# Patient Record
Sex: Female | Born: 2000 | Race: Black or African American | Hispanic: No | Smoking: Never smoker
Health system: Southern US, Community
[De-identification: ages and names within clinical notes are randomized; demographics above are authoritative.]

## PROBLEM LIST (undated history)

## (undated) DIAGNOSIS — L709 Acne, unspecified: Secondary | ICD-10-CM

## (undated) DIAGNOSIS — E559 Vitamin D deficiency, unspecified: Secondary | ICD-10-CM

## (undated) HISTORY — PX: NO PAST SURGERIES: SHX2092

## (undated) HISTORY — DX: Vitamin D deficiency, unspecified: E55.9

## (undated) HISTORY — DX: Acne, unspecified: L70.9

---
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
## 2019-01-07 ENCOUNTER — Encounter: Payer: Self-pay | Admitting: Certified Nurse Midwife

## 2019-01-08 ENCOUNTER — Other Ambulatory Visit: Payer: Self-pay

## 2019-01-08 MED ORDER — FUSION PLUS PO CAPS: 1.0000 | ORAL_CAPSULE | Freq: Every day | ORAL | 10 refills | Status: DC

## 2019-02-11 ENCOUNTER — Encounter: Payer: Medicaid Other | Admitting: Family Medicine

## 2019-02-25 ENCOUNTER — Other Ambulatory Visit: Payer: Self-pay

## 2019-02-25 ENCOUNTER — Ambulatory Visit (INDEPENDENT_AMBULATORY_CARE_PROVIDER_SITE_OTHER): Payer: Medicaid Other | Admitting: Certified Nurse Midwife

## 2019-02-25 ENCOUNTER — Encounter: Payer: Self-pay | Admitting: Certified Nurse Midwife

## 2019-02-25 VITALS — BP 127/82 | HR 88 | Ht 68.0 in | Wt 136.0 lb

## 2019-02-25 DIAGNOSIS — N946 Dysmenorrhea, unspecified: Secondary | ICD-10-CM | POA: Diagnosis not present

## 2019-02-25 DIAGNOSIS — Z8742 Personal history of other diseases of the female genital tract: Secondary | ICD-10-CM | POA: Diagnosis not present

## 2019-02-25 DIAGNOSIS — Z3045 Encounter for surveillance of transdermal patch hormonal contraceptive device: Secondary | ICD-10-CM | POA: Diagnosis not present

## 2019-02-25 NOTE — Patient Instructions (Addendum)
Dysmenorrhea Dysmenorrhea means painful cramps during your period (menstrual period). You will have pain in your lower belly (abdomen). The pain is caused by the tightening (contracting) of the muscles of the womb (uterus). The pain may be mild or very bad. With this condition, you may:  Have a headache.  Feel sick to your stomach (nauseous).  Throw up (vomit).  Have lower back pain. Follow these instructions at home: Helping pain and cramping   Put heat on your lower back or belly when you have pain or cramps. Use the heat source that your doctor tells you to use. ? Place a towel between your skin and the heat. ? Leave the heat on for 20-30 minutes. ? Remove the heat if your skin turns bright red. This is especially important if you cannot feel pain, heat, or cold. ? Do not have a heating pad on during sleep.  Do aerobic exercises. These include walking, swimming, or biking. These may help with cramps.  Massage your lower back or belly. This may help lessen pain. General instructions  Take over-the-counter and prescription medicines only as told by your doctor.  Do not drive or use heavy machinery while taking prescription pain medicine.  Avoid alcohol and caffeine during and right before your period. These can make cramps worse.  Do not use any products that have nicotine or tobacco. These include cigarettes and e-cigarettes. If you need help quitting, ask your doctor.  Keep all follow-up visits as told by your doctor. This is important. Contact a doctor if:  You have pain that gets worse.  You have pain that does not get better with medicine.  You have pain during sex.  You feel sick to your stomach or you throw up during your period, and medicine does not help. Get help right away if:  You pass out (faint). Summary  Dysmenorrhea means painful cramps during your period (menstrual period).  Put heat on your lower back or belly when you have pain or cramps.  Do  exercises like walking, swimming, or biking to help with cramps.  Contact a doctor if you have pain during sex. This information is not intended to replace advice given to you by your health care provider. Make sure you discuss any questions you have with your health care provider. Document Released: 02/10/2009 Document Revised: 12/01/2016 Document Reviewed: 12/01/2016 Elsevier Interactive Patient Education  2019 Elsevier Inc. Ethinyl Estradiol; Norelgestromin skin patches What is this medicine? ETHINYL ESTRADIOL;NORELGESTROMIN (ETH in il es tra DYE ole; nor el JES troe min) skin patch is used as a contraceptive (birth control method). This medicine combines two types of female hormones, an estrogen and a progestin. This patch is used to prevent ovulation and pregnancy. This medicine may be used for other purposes; ask your health care provider or pharmacist if you have questions. COMMON BRAND NAME(S): Ortho Christianne Borrow What should I tell my health care provider before I take this medicine? They need to know if you have or ever had any of these conditions: -abnormal vaginal bleeding -blood vessel disease or blood clots -breast, cervical, endometrial, ovarian, liver, or uterine cancer -diabetes -gallbladder disease -heart disease or recent heart attack -high blood pressure -high cholesterol -kidney disease -liver disease -migraine headaches -stroke -systemic lupus erythematosus (SLE) -tobacco smoker -an unusual or allergic reaction to estrogens, progestins, other medicines, foods, dyes, or preservatives -pregnant or trying to get pregnant -breast-feeding How should I use this medicine? This patch is applied to the skin. Follow the directions  on the prescription label. Apply to clean, dry, healthy skin on the buttock, abdomen, upper outer arm or upper torso, in a place where it will not be rubbed by tight clothing. Do not use lotions or other cosmetics on the site where the patch will  go. Press the patch firmly in place for 10 seconds to ensure good contact with the skin. Change the patch every 7 days on the same day of the week for 3 weeks. You will then have a break from the patch for 1 week, after which you will apply a new patch. Do not use your medicine more often than directed. Contact your pediatrician regarding the use of this medicine in children. Special care may be needed. This medicine has been used in female children who have started having menstrual periods. A patient package insert for the product will be given with each prescription and refill. Read this sheet carefully each time. The sheet may change frequently. Overdosage: If you think you have taken too much of this medicine contact a poison control center or emergency room at once. NOTE: This medicine is only for you. Do not share this medicine with others. What if I miss a dose? You will need to replace your patch once a week as directed. If your patch is lost or falls off, contact your health care professional for advice. You may need to use another form of birth control if your patch has been off for more than 1 day. What may interact with this medicine? Do not take this medicine with the following medication: -dasabuvir; ombitasvir; paritaprevir; ritonavir -ombitasvir; paritaprevir; ritonavir This medicine may also interact with the following medications: -acetaminophen -antibiotics or medicines for infections, especially rifampin, rifabutin, rifapentine, and griseofulvin, and possibly penicillins or tetracyclines -aprepitant -ascorbic acid (vitamin C) -atorvastatin -barbiturate medicines, such as phenobarbital -bosentan -carbamazepine -caffeine -clofibrate -cyclosporine -dantrolene -doxercalciferol -felbamate -grapefruit juice -hydrocortisone -medicines for anxiety or sleeping problems, such as diazepam or temazepam -medicines for diabetes, including  pioglitazone -modafinil -mycophenolate -nefazodone -oxcarbazepine -phenytoin -prednisolone -ritonavir or other medicines for HIV infection or AIDS -rosuvastatin -selegiline -soy isoflavones supplements -St. John's wort -tamoxifen or raloxifene -theophylline -thyroid hormones -topiramate -warfarin This list may not describe all possible interactions. Give your health care provider a list of all the medicines, herbs, non-prescription drugs, or dietary supplements you use. Also tell them if you smoke, drink alcohol, or use illegal drugs. Some items may interact with your medicine. What should I watch for while using this medicine? Visit your doctor or health care professional for regular checks on your progress. You will need a regular breast and pelvic exam and Pap smear while on this medicine. Use an additional method of contraception during the first cycle that you use this patch. If you have any reason to think you are pregnant, stop using this medicine right away and contact your doctor or health care professional. If you are using this medicine for hormone related problems, it may take several cycles of use to see improvement in your condition. Smoking increases the risk of getting a blood clot or having a stroke while you are using hormonal birth control, especially if you are more than 19 years old. You are strongly advised not to smoke. This medicine can make your body retain fluid, making your fingers, hands, or ankles swell. Your blood pressure can go up. Contact your doctor or health care professional if you feel you are retaining fluid. This medicine can make you more sensitive to the sun.  Keep out of the sun. If you cannot avoid being in the sun, wear protective clothing and use sunscreen. Do not use sun lamps or tanning beds/booths. If you wear contact lenses and notice visual changes, or if the lenses begin to feel uncomfortable, consult your eye care specialist. In some  women, tenderness, swelling, or minor bleeding of the gums may occur. Notify your dentist if this happens. Brushing and flossing your teeth regularly may help limit this. See your dentist regularly and inform your dentist of the medicines you are taking. If you are going to have elective surgery or a MRI, you may need to stop using this medicine before the surgery or MRI. Consult your health care professional for advice. This medicine does not protect you against HIV infection (AIDS) or any other sexually transmitted diseases. What side effects may I notice from receiving this medicine? Side effects that you should report to your doctor or health care professional as soon as possible: -breast tissue changes or discharge -changes in vaginal bleeding during your period or between your periods -chest pain -coughing up blood -dizziness or fainting spells -headaches or migraines -leg, arm or groin pain -severe or sudden headaches -stomach pain (severe) -sudden shortness of breath -sudden loss of coordination, especially on one side of the body -speech problems -symptoms of vaginal infection like itching, irritation or unusual discharge -tenderness in the upper abdomen -vomiting -weakness or numbness in the arms or legs, especially on one side of the body -yellowing of the eyes or skin Side effects that usually do not require medical attention (report to your doctor or health care professional if they continue or are bothersome): -breakthrough bleeding and spotting that continues beyond the 3 initial cycles of pills -breast tenderness -mood changes, anxiety, depression, frustration, anger, or emotional outbursts -increased sensitivity to sun or ultraviolet light -nausea -skin rash, acne, or brown spots on the skin -weight gain (slight) This list may not describe all possible side effects. Call your doctor for medical advice about side effects. You may report side effects to FDA at  1-800-FDA-1088. Where should I keep my medicine? Keep out of the reach of children. Store at room temperature between 15 and 30 degrees C (59 and 86 degrees F). Keep the patch in its pouch until time of use. Throw away any unused medicine after the expiration date. Dispose of used patches properly. Since a used patch may still contain active hormones, fold the patch in half so that it sticks to itself prior to disposal. Throw away in a place where children or pets cannot reach. NOTE: This sheet is a summary. It may not cover all possible information. If you have questions about this medicine, talk to your doctor, pharmacist, or health care provider.  2019 Elsevier/Gold Standard (2016-07-25 07:59:03)

## 2019-02-25 NOTE — Progress Notes (Signed)
Virtual Visit via Telephone Note  I connected with Casey Garza on 02/25/19 at 3:00 PM by telephone and verified that I am speaking with the correct person using two identifiers.   I discussed the limitations, risks, security and privacy concerns of performing an evaluation and management service by telephone and the availability of in person appointments. I also discussed with the patient that there may be a patient responsible charge related to this service. The patient expressed understanding and agreed to proceed.   History of Present Illness:  Patient last seen in office on 12/25/2018 for evaluation of abnormal uterine bleeding. Previously used depo-provera as contraception due inability to remember pill. Discussed all options and decided to try patch.   Likes patch, easy to use. Notes new onset dysmenorrhea. Manages symptoms with Motrin and Tylenol as needed.   Denies difficulty breathing or respiratory distress, chest pain, abdominal pain, excessive vaginal bleeding, dysuria, and leg pain or swelling.   Observations/Objective:  Patient's last menstrual period was 02/25/2019. Period Cycle (Days): 28 Period Duration (Days): Four (4) to five (5) Period Pattern: Regular Menstrual Flow: Moderate Menstrual Control: Thin pad, Tampon Menstrual Control Change Freq (Hours): One (1) to two (2)  Dysmenorrhea: (!) Moderate Dysmenorrhea Symptoms: Cramping, Other (Comment)(back pain)   Assessment and Plan:  Contraceptive patch use-will continue Dysmenorrhea-information given regarding use of herbs, see MyChart  Follow Up Instructions:    I discussed the assessment and treatment plan with the patient. The patient was provided an opportunity to ask questions and all were answered. The patient agreed with the plan and demonstrated an understanding of the instructions.   The patient was advised to call back or seek an in-person evaluation if the symptoms worsen or if the condition fails  to improve as anticipated.  Advised to return to clinic in one (1) year for ANNUAL EXAM.   I provided 10 minutes of non-face-to-face time during this encounter.    Gunnar Bulla, CNM [facility], [facility] 02/25/19 3:11 PM

## 2019-03-13 ENCOUNTER — Encounter: Payer: Self-pay | Admitting: Family Medicine

## 2019-03-15 ENCOUNTER — Ambulatory Visit: Payer: Medicaid Other

## 2019-03-19 ENCOUNTER — Telehealth: Payer: Self-pay | Admitting: Certified Nurse Midwife

## 2019-03-19 ENCOUNTER — Other Ambulatory Visit: Payer: Self-pay | Admitting: *Deleted

## 2019-03-19 MED ORDER — FLUCONAZOLE 150 MG PO TABS: 150.0000 mg | ORAL_TABLET | Freq: Once | ORAL | 0 refills | Status: DC

## 2019-03-19 NOTE — Telephone Encounter (Signed)
The patient called and stated that she has a yeast infection. The patient is hoping to receive a call back sometime today from her nurse. Please advise.

## 2019-03-19 NOTE — Telephone Encounter (Signed)
Called pt sent in diflucan pt voiced understanding

## 2019-03-22 ENCOUNTER — Telehealth: Payer: Self-pay

## 2019-03-22 ENCOUNTER — Other Ambulatory Visit: Payer: Self-pay

## 2019-03-22 ENCOUNTER — Other Ambulatory Visit: Payer: Self-pay | Admitting: Obstetrics and Gynecology

## 2019-03-22 ENCOUNTER — Telehealth: Payer: Self-pay | Admitting: Certified Nurse Midwife

## 2019-03-22 DIAGNOSIS — R350 Frequency of micturition: Secondary | ICD-10-CM

## 2019-03-22 NOTE — Telephone Encounter (Signed)
Patient picked up script for diflucan on 4/21. She called stating her symptoms are worsening and finished the medication. Please Advise.

## 2019-03-22 NOTE — Telephone Encounter (Signed)
Patient called to state she took diflucan 3 days in a row- not 1 tab every 3 days as prescribed. She now states she feels horrible. She is aldo c/o frequency and urgency with burning. She will come to office in the next 15 minutes to leave a urine sample for a culture to be sent to lab.

## 2019-03-25 ENCOUNTER — Other Ambulatory Visit: Payer: Medicaid Other

## 2019-03-25 ENCOUNTER — Other Ambulatory Visit: Payer: Self-pay

## 2019-03-25 DIAGNOSIS — R350 Frequency of micturition: Secondary | ICD-10-CM | POA: Diagnosis not present

## 2019-03-28 ENCOUNTER — Other Ambulatory Visit: Payer: Self-pay | Admitting: Certified Nurse Midwife

## 2019-03-28 DIAGNOSIS — N3000 Acute cystitis without hematuria: Secondary | ICD-10-CM

## 2019-03-28 LAB — URINE CULTURE

## 2019-03-28 LAB — SPECIMEN STATUS REPORT

## 2019-03-28 MED ORDER — NITROFURANTOIN MONOHYD MACRO 100 MG PO CAPS: 100.0000 mg | ORAL_CAPSULE | Freq: Two times a day (BID) | ORAL | 0 refills | Status: DC

## 2019-05-20 ENCOUNTER — Other Ambulatory Visit: Payer: Self-pay

## 2019-05-20 ENCOUNTER — Other Ambulatory Visit (INDEPENDENT_AMBULATORY_CARE_PROVIDER_SITE_OTHER): Payer: Medicaid Other | Admitting: Certified Nurse Midwife

## 2019-05-20 ENCOUNTER — Other Ambulatory Visit: Payer: Medicaid Other

## 2019-05-20 DIAGNOSIS — R300 Dysuria: Secondary | ICD-10-CM

## 2019-05-20 DIAGNOSIS — R319 Hematuria, unspecified: Secondary | ICD-10-CM

## 2019-05-20 DIAGNOSIS — N898 Other specified noninflammatory disorders of vagina: Secondary | ICD-10-CM

## 2019-05-20 LAB — POCT URINALYSIS DIPSTICK
Bilirubin, UA: NEGATIVE
Glucose, UA: NEGATIVE
Ketones, UA: NEGATIVE
Leukocytes, UA: NEGATIVE
Nitrite, UA: NEGATIVE
Protein, UA: POSITIVE — AB
Spec Grav, UA: 1.02 (ref 1.010–1.025)
Urobilinogen, UA: 0.2 E.U./dL
pH, UA: 5 (ref 5.0–8.0)

## 2019-05-20 MED ORDER — PHENAZOPYRIDINE HCL 200 MG PO TABS: 200.0000 mg | ORAL_TABLET | Freq: Three times a day (TID) | ORAL | 1 refills | Status: DC | PRN

## 2019-05-20 MED ORDER — NITROFURANTOIN MONOHYD MACRO 100 MG PO CAPS: 100.0000 mg | ORAL_CAPSULE | Freq: Two times a day (BID) | ORAL | 0 refills | Status: DC

## 2019-05-20 NOTE — Progress Notes (Signed)
Patient stopped by the office to leave a urine specimen for a dip and culture. While speaking with the patient she mentioned she had a "fishy" vaginal smell. Per ML patient was given a NuSwab to collect a self specimen to be sent to lab. Patient complied and all questions were answered.

## 2019-05-22 ENCOUNTER — Other Ambulatory Visit: Payer: Self-pay | Admitting: Certified Nurse Midwife

## 2019-05-22 DIAGNOSIS — N3001 Acute cystitis with hematuria: Secondary | ICD-10-CM

## 2019-05-22 LAB — URINE CULTURE

## 2019-05-22 MED ORDER — SULFAMETHOXAZOLE-TRIMETHOPRIM 800-160 MG PO TABS: 1.0000 | ORAL_TABLET | Freq: Two times a day (BID) | ORAL | 1 refills | Status: DC

## 2019-05-23 LAB — NUSWAB VAGINITIS PLUS (VG+)
Atopobium vaginae: HIGH Score — AB
Candida albicans, NAA: NEGATIVE
Candida glabrata, NAA: NEGATIVE
Chlamydia trachomatis, NAA: NEGATIVE
Neisseria gonorrhoeae, NAA: NEGATIVE
Trich vag by NAA: NEGATIVE

## 2019-05-24 ENCOUNTER — Encounter: Payer: Self-pay | Admitting: Certified Nurse Midwife

## 2019-05-24 ENCOUNTER — Other Ambulatory Visit: Payer: Self-pay | Admitting: Certified Nurse Midwife

## 2019-05-24 ENCOUNTER — Telehealth: Payer: Self-pay | Admitting: Certified Nurse Midwife

## 2019-05-24 DIAGNOSIS — N390 Urinary tract infection, site not specified: Secondary | ICD-10-CM

## 2019-05-24 MED ORDER — CIPROFLOXACIN HCL 500 MG PO TABS: 500.0000 mg | ORAL_TABLET | Freq: Two times a day (BID) | ORAL | 0 refills | Status: DC

## 2019-05-24 NOTE — Telephone Encounter (Signed)
Spoke with patient, aware Cipro was sent in.  Patient verbalized understanding.  Allergies updated in computer.

## 2019-05-24 NOTE — Telephone Encounter (Signed)
Patient called stating she is allergic to bactrim. She broke out in a rash and was feeling very nauseous. She contacted [facility] and they advised her to stop the medication and contact her Dr. Please Advise.-KEC

## 2019-05-24 NOTE — Telephone Encounter (Signed)
Please contact patient, Rx Cipro already sent to pharmacy on file and allergies updated in computer. Thanks, JML

## 2019-06-10 ENCOUNTER — Encounter: Payer: Self-pay | Admitting: Family Medicine

## 2019-06-11 ENCOUNTER — Other Ambulatory Visit: Payer: Self-pay

## 2019-06-11 ENCOUNTER — Encounter: Payer: Self-pay | Admitting: Nurse Practitioner

## 2019-06-11 ENCOUNTER — Ambulatory Visit (INDEPENDENT_AMBULATORY_CARE_PROVIDER_SITE_OTHER): Payer: Medicaid Other | Admitting: Nurse Practitioner

## 2019-06-11 VITALS — BP 112/68 | HR 65 | Temp 97.3°F | Resp 16 | Ht 68.0 in | Wt 147.4 lb

## 2019-06-11 DIAGNOSIS — Z0000 Encounter for general adult medical examination without abnormal findings: Secondary | ICD-10-CM

## 2019-06-11 NOTE — Progress Notes (Signed)
Routine Well-Adolescent Visit   PCP: Arnetha Courser, MD   History was provided by the patient.  Casey Garza is a 18 y.o. female who is here for physical   Current concerns: none    Adolescent Assessment:  Confidentiality was discussed with the patient and if applicable, with caregiver as well.  Home and Environment:  Lives with: lives at home with mom and brother Parental relations: good  Friends/Peers: neighbor Nutrition/Eating Behaviors: snacks mostly throught out the day- has breakfast- biscuit and fries, snacks on popcorn, cheez-its, candy occasionally, dinner- homecooked- hamburgers, onions, rice, green beans.  Drinks: water 4 bottles. gatorade- 1 bottle  Sports/Exercise: just walking at work.   Education and Employment:  School Status: in [employer] in college  in regular classroom and is doing well School History: School attendance is regular. Work: working full-time during the summer  Activities:  none   Patient reports being comfortable and safe at school and at home? Yes  Smoking: no vaping Secondhand smoke exposure? no Drugs/EtOH: denies    Sexuality:  -Menarche: post menarchal, onset 18 years old  - females:  last menses: today  - Menstrual History: regular every month without intermenstrual spotting  - Sexually active? yes -  monogamous relationship  - sexual partners in last year: one  - contraception use: patch - Last STI Screening: January 220  - Violence/Abuse: denies   Mood: Suicidality and Depression: denies Depression screen [facility] 2/9 06/11/2019 09/21/2018  Decreased Interest 0 0  Down, Depressed, Hopeless 0 0  PHQ - 2 Score 0 0  Altered sleeping 0 0  Tired, decreased energy 0 0  Change in appetite 0 0  Feeling bad or failure about yourself  0 0  Trouble concentrating 0 0  Moving slowly or fidgety/restless 0 0  Suicidal thoughts 0 0  PHQ-9 Score 0 0  Difficult doing work/chores Not difficult at all Not difficult at all   Negative.   Weapons: denies     Physical Exam:  There were no vitals taken for this visit. Blood pressure percentiles are not available for patients who are 18 years or older.  General Appearance:   alert, oriented, no acute distress  HENT: Normocephalic, no obvious abnormality, PERRL, EOM's intact, conjunctiva clear  Mouth:   Normal appearing teeth, no obvious discoloration, dental caries, or dental caps  Neck:   Supple; thyroid: no enlargement, symmetric, no tenderness/mass/nodules  Lungs:   Clear to auscultation bilaterally, normal work of breathing  Heart:   Regular rate and rhythm, S1 and S2 normal, no murmurs;   Abdomen:   Soft, non-tender, no mass, or organomegaly  GU genitalia not examined  Musculoskeletal:   Tone and strength strong and symmetrical, all extremities               Lymphatic:   No cervical adenopathy  Skin/Hair/Nails:   Skin warm, dry and intact, no rashes, no bruises or petechiae  Neurologic:   Strength, gait, and coordination normal and age-appropriate    Assessment/Plan:  BMI: is appropriate for age  Immunizations today: discussed HPV second dose- declined.   Discussed with adolescent   the importance of limiting screen time to no more than 2 hours per day, exercise daily for at least 2 hours, eat 6 servings of fruit and vegetables daily, eat tree nuts ( pistachios, pecans , almonds...) one serving every other day, eat fish twice weekly. Read daily. Get involved in school. Have responsibilities  at home. To avoid STI's, practice abstinence, if  unable use condoms and stick with one partner.  Discussed importance of contraception if sexually active to avoid unwanted pregnancy.   Patient declined blood work today- discussed b12 and vitamin D supplementation.  - Follow-up visit in 1 year for next visit, or sooner as needed.   Cheryle HorsfallElizabeth E Yessenia Maillet, NP

## 2019-06-11 NOTE — Patient Instructions (Addendum)
Make sure you you are  limiting screen time to no more than 2 hours per day, exercise daily for at least 2 hours, eat 5 servings of fruit and vegetables daily, eat tree nuts ( pistachios, pecans , almonds...) one serving every other day, eat fish twice weekly. Read daily. Get involved in school. Have responsibilities  at home. To avoid STI's, practice abstinence, if unable use condoms and stick with one partner.  Discussed importance of contraception if sexually active to avoid unwanted pregnancy.    Please take 500 mcg of vitamin B12 daily and 800-2,000 IU of vitamin D3 daily- you may get these both in a multivitamin instead of 2 separate tablets.

## 2019-07-03 ENCOUNTER — Ambulatory Visit (INDEPENDENT_AMBULATORY_CARE_PROVIDER_SITE_OTHER): Payer: Medicaid Other | Admitting: Family Medicine

## 2019-07-03 ENCOUNTER — Other Ambulatory Visit: Payer: Self-pay

## 2019-07-03 ENCOUNTER — Other Ambulatory Visit (HOSPITAL_COMMUNITY)
Admission: RE | Admit: 2019-07-03 | Discharge: 2019-07-03 | Disposition: A | Discharge status: Home / Self Care | Payer: Medicaid Other | Source: Ambulatory Visit | Attending: Family Medicine | Admitting: Family Medicine

## 2019-07-03 ENCOUNTER — Encounter: Payer: Self-pay | Admitting: Family Medicine

## 2019-07-03 VITALS — BP 112/76 | HR 112 | Temp 97.1°F | Resp 16 | Ht 68.0 in | Wt 149.6 lb

## 2019-07-03 DIAGNOSIS — N898 Other specified noninflammatory disorders of vagina: Secondary | ICD-10-CM

## 2019-07-03 DIAGNOSIS — Z113 Encounter for screening for infections with a predominantly sexual mode of transmission: Secondary | ICD-10-CM

## 2019-07-03 NOTE — Progress Notes (Signed)
Name: Casey Garza   MRN: [id number]    DOB: 26-Dec-2000   Date:07/03/2019       Progress Note  Subjective  Chief Complaint  Chief Complaint  Patient presents with  . Vaginal Discharge    Has been going to see her GYN and dx with UTI antibiotic is not helping. Onset-3 months and keeps coming with with white cottage cheese discharge    HPI Vaginal discharge recurrent for the past 6 months.  Discharge changed from her baseline, some vaginitis has been associated with vaginal itching.  Current vaginal discharge is white, normal viscosity, more copious, malodorous, onset one week ago.  No recent change to bathing, soaps, detergents, antibiotics/meds before the onset Same sexual partner, female, been together only with each other x 7 months, no STD testing since they have been together, on birth control patch, not using condoms or other barrier methods. No dysuria, hematuria   In April 2020 was treated with diflucan x 2 and sx improved In June 2020 treated with oral abx macrobid, nitrofurantion.     Patient Active Problem List   Diagnosis Date Noted  . Dysmenorrhea 02/25/2019  . Vitamin D deficiency disease   . Acne     History reviewed. No pertinent surgical history.  Family History  Problem Relation Age of Onset  . Diabetes Maternal Grandmother   . Hypertension Maternal Grandmother   . Breast cancer Neg Hx   . Ovarian cancer Neg Hx   . Colon cancer Neg Hx     Social History   Socioeconomic History  . Marital status: Single    Spouse name: Not on file  . Number of children: Not on file  . Years of education: 30  . Highest education level: High school graduate  Occupational History  . Occupation: Psychologist, counselling  Social Needs  . Financial resource strain: Not hard at all  . Food insecurity    Worry: Never true    Inability: Never true  . Transportation needs    Medical: No    Non-medical: No  Tobacco Use  . Smoking status: Never Smoker  . Smokeless tobacco: Never Used   Substance and Sexual Activity  . Alcohol use: No  . Drug use: Not Currently  . Sexual activity: Yes    Birth control/protection: Patch  Lifestyle  . Physical activity    Days per week: 0 days    Minutes per session: 0 min  . Stress: Not at all  Relationships  . Social connections    Talks on phone: More than three times a week    Gets together: Twice a week    Attends religious service: Never    Active member of club or organization: No    Attends meetings of clubs or organizations: Never    Relationship status: Never married  . Intimate partner violence    Fear of current or ex partner: Patient refused    Emotionally abused: Patient refused    Physically abused: Patient refused    Forced sexual activity: Patient refused  Other Topics Concern  . Not on file  Social History Narrative  . Not on file     Current Outpatient Medications:  .  norelgestromin-ethinyl estradiol (ORTHO EVRA) 150-35 MCG/24HR transdermal patch, Place 1 patch onto the skin once a week., Disp: 3 patch, Rfl: 12 .  RETIN-A 0.025 % cream, APPLY TOPICALLY AT BEDTIME. TO AFFECTED SKIN, FOR ACNE, Disp: , Rfl:   Allergies  Allergen Reactions  . Bactrim [Sulfamethoxazole-Trimethoprim]  Rash    Rash, dizzy, nausea    I personally reviewed active problem list, medication list, allergies, family history, social history with the patient/caregiver today.   ROS  Ten systems reviewed and is negative except as mentioned in HPI   Objective  Vitals:   07/03/19 1158  BP: 112/76  Pulse: (!) 112  Resp: 16  Temp: (!) 97.1 F (36.2 C)  TempSrc: Temporal  SpO2: 99%  Weight: 149 lb 9.6 oz (67.9 kg)  Height: 5\' 8"  (1.727 m)    Body mass index is 22.75 kg/m.  Physical Exam  Constitutional: Patient appears well-developed and well-nourished.  No distress.  HEENT: head atraumatic, normocephalic, pupils equal and reactive to light,  neck supple Cardiovascular: Normal rate, regular rhythm and normal heart  sounds.  No murmur heard. No BLE edema. Pulmonary/Chest: Effort normal and breath sounds normal. No respiratory distress. Abdominal: Soft.  There is no tenderness. GYN mild white discharge, vaginal walls normal, cervix normal, no bimanual motion tenderness of pelvic masses  Psychiatric: Patient has a normal mood and affect. behavior is normal. Judgment and thought content normal.  Recent Results (from the past 2160 hour(s))  POCT urinalysis dipstick     Status: Abnormal   Collection Time: 05/20/19  1:54 PM  Result Value Ref Range   Color, UA yellow    Clarity, UA clear    Glucose, UA Negative Negative   Bilirubin, UA neg    Ketones, UA neg    Spec Grav, UA 1.020 1.010 - 1.025   Blood, UA small    pH, UA 5.0 5.0 - 8.0   Protein, UA Positive (A) Negative   Urobilinogen, UA 0.2 0.2 or 1.0 E.U./dL   Nitrite, UA neg    Leukocytes, UA Negative Negative   Appearance     Odor    Urine Culture     Status: Abnormal   Collection Time: 05/20/19  2:59 PM   Specimen: Urine   UR  Result Value Ref Range   Urine Culture, Routine Final report (A)    Organism ID, Bacteria Klebsiella pneumoniae (A)     Comment: Greater than 100,000 colony forming units per mL Cefazolin <=4 ug/mL Cefazolin with an MIC <=16 predicts susceptibility to the oral agents cefaclor, cefdinir, cefpodoxime, cefprozil, cefuroxime, cephalexin, and loracarbef when used for therapy of uncomplicated urinary tract infections due to E. coli, Klebsiella pneumoniae, and Proteus mirabilis.    Antimicrobial Susceptibility Comment     Comment:       ** S = Susceptible; I = Intermediate; R = Resistant **                    P = Positive; N = Negative             MICS are expressed in micrograms per mL    Antibiotic                 RSLT#1    RSLT#2    RSLT#3    RSLT#4 Amoxicillin/Clavulanic Acid    S Ampicillin                     R Cefepime                       S Ceftriaxone                    S Cefuroxime  S Ciprofloxacin                  S Ertapenem                      S Gentamicin                     S Imipenem                       S Levofloxacin                   S Meropenem                      S Nitrofurantoin                 R Piperacillin/Tazobactam        S Tetracycline                   S Tobramycin                     S Trimethoprim/Sulfa             S   NuSwab Vaginitis Plus (VG+)     Status: Abnormal   Collection Time: 05/20/19  4:00 PM  Result Value Ref Range   Atopobium vaginae High - 2 (A) Score   BVAB 2 Low - 0 Score   Megasphaera 1 Low - 0 Score    Comment: Calculate total score by adding the 3 individual bacterial vaginosis (BV) marker scores together.  Total score is interpreted as follows: Total score 0-1: Indicates the absence of BV. Total score   2: Indeterminate for BV. Additional clinical                  data should be evaluated to establish a                  diagnosis. Total score 3-6: Indicates the presence of BV. This test was developed and its performance characteristics determined by [facility].  It has not been cleared or approved by the Food and Drug Administration.  The FDA has determined that such clearance or approval is not necessary.    Candida albicans, NAA Negative Negative   Candida glabrata, NAA Negative Negative   Trich vag by NAA Negative Negative   Chlamydia trachomatis, NAA Negative Negative   Neisseria gonorrhoeae, NAA Negative Negative     PHQ2/9: Depression screen [facility] 07/03/2019 06/11/2019 09/21/2018  Decreased Interest 0 0 0  Down, Depressed, Hopeless 0 0 0  PHQ - 2 Score 0 0 0  Altered sleeping 0 0 0  Tired, decreased energy 0 0 0  Change in appetite 0 0 0  Feeling bad or failure about yourself  0 0 0  Trouble concentrating 0 0 0  Moving slowly or fidgety/restless 0 0 0  Suicidal thoughts 0 0 0  PHQ-9 Score 0 0 0  Difficult doing work/chores Not difficult at all Not difficult at all Not difficult at all    phq 9  is negative   Fall Risk: Fall Risk  07/03/2019 06/11/2019 09/21/2018  Falls in the past year? 0 0 No  Number falls in past yr: 0 0 -  Injury with Fall? 0 0 -    Functional Status Survey: Is the patient deaf or have difficulty hearing?: No Does the patient  have difficulty seeing, even when wearing glasses/contacts?: No Does the patient have difficulty concentrating, remembering, or making decisions?: No Does the patient have difficulty walking or climbing stairs?: No Does the patient have difficulty dressing or bathing?: No Does the patient have difficulty doing errands alone such as visiting a doctor's office or shopping?: No   Assessment & Plan  1. Vaginal discharge  - Cervicovaginal ancillary only  2. Vaginal odor  - Cervicovaginal ancillary only  3. Routine screening for STI (sexually transmitted infection)  - RPR - Hepatitis panel, acute - HIV Antibody (routine testing w rflx)

## 2019-07-04 LAB — HIV ANTIBODY (ROUTINE TESTING W REFLEX): HIV 1&2 Ab, 4th Generation: NONREACTIVE

## 2019-07-04 LAB — HEPATITIS PANEL, ACUTE
Hep A IgM: NONREACTIVE
Hep B C IgM: NONREACTIVE
Hepatitis B Surface Ag: NONREACTIVE
Hepatitis C Ab: NONREACTIVE
SIGNAL TO CUT-OFF: 0.02 (ref <1.00)

## 2019-07-04 LAB — RPR: RPR Ser Ql: NONREACTIVE

## 2019-07-06 LAB — CERVICOVAGINAL ANCILLARY ONLY
Bacterial vaginitis: POSITIVE — AB
Candida vaginitis: NEGATIVE
Chlamydia: NEGATIVE
Neisseria Gonorrhea: NEGATIVE
Trichomonas: NEGATIVE

## 2019-07-08 ENCOUNTER — Other Ambulatory Visit: Payer: Self-pay | Admitting: Family Medicine

## 2019-07-08 ENCOUNTER — Encounter: Payer: Self-pay | Admitting: Family Medicine

## 2019-07-08 MED ORDER — METRONIDAZOLE 500 MG PO TABS: 500.0000 mg | ORAL_TABLET | Freq: Two times a day (BID) | ORAL | 0 refills | Status: DC

## 2019-09-05 ENCOUNTER — Other Ambulatory Visit: Payer: Self-pay

## 2019-09-05 ENCOUNTER — Ambulatory Visit (INDEPENDENT_AMBULATORY_CARE_PROVIDER_SITE_OTHER): Payer: Medicaid Other | Admitting: Family Medicine

## 2019-09-05 ENCOUNTER — Encounter: Payer: Self-pay | Admitting: Family Medicine

## 2019-09-05 DIAGNOSIS — J989 Respiratory disorder, unspecified: Secondary | ICD-10-CM | POA: Diagnosis not present

## 2019-09-05 DIAGNOSIS — Z20822 Contact with and (suspected) exposure to covid-19: Secondary | ICD-10-CM

## 2019-09-05 DIAGNOSIS — Z20828 Contact with and (suspected) exposure to other viral communicable diseases: Secondary | ICD-10-CM | POA: Diagnosis not present

## 2019-09-05 MED ORDER — BENZONATATE 100 MG PO CAPS: 100.0000 mg | ORAL_CAPSULE | Freq: Three times a day (TID) | ORAL | 0 refills | Status: DC | PRN

## 2019-09-05 MED ORDER — FLUTICASONE PROPIONATE 50 MCG/ACT NA SUSP: 1.0000 | Freq: Every day | NASAL | 1 refills | Status: DC

## 2019-09-05 MED ORDER — GUAIFENESIN ER 600 MG PO TB12: 600.0000 mg | ORAL_TABLET | Freq: Two times a day (BID) | ORAL | 0 refills | Status: DC | PRN

## 2019-09-05 MED ORDER — IBUPROFEN 600 MG PO TABS: 600.0000 mg | ORAL_TABLET | Freq: Three times a day (TID) | ORAL | 0 refills | Status: DC | PRN

## 2019-09-05 NOTE — Progress Notes (Signed)
Name: Casey Garza   MRN: [id number]    DOB: December 13, 2000   Date:09/05/2019       Progress Note  Subjective  Chief Complaint  Chief Complaint  Patient presents with  . Sore Throat    weak, runny nose for 2 days  . Ear Fullness    left ear  . Cough    I connected with  Erroll Luna  on 09/05/19 at 11:20 AM EDT by a video enabled telemedicine application and verified that I am speaking with the correct person using two identifiers.  I discussed the limitations of evaluation and management by telemedicine and the availability of in person appointments. The patient expressed understanding and agreed to proceed. Staff also discussed with the patient that there may be a patient responsible charge related to this service. Patient Location: Home Provider Location: Office Additional Individuals present: none  HPI  Pt presents with concern for 2 days of respiratory illness; started with a headache, productive cough, sore throat, bilateral otalgia/ear pressure, hoarse voice.  No known exposure to COVID-19 - needs testing for her job.  We will send her for this today. She has tried theraflu without relief. Denies fevers, chills, abdominal pain, NVD.   Patient Active Problem List   Diagnosis Date Noted  . Dysmenorrhea 02/25/2019  . Vitamin D deficiency disease   . Acne     Social History   Tobacco Use  . Smoking status: Never Smoker  . Smokeless tobacco: Never Used  Substance Use Topics  . Alcohol use: No     Current Outpatient Medications:  .  norelgestromin-ethinyl estradiol (ORTHO EVRA) 150-35 MCG/24HR transdermal patch, Place 1 patch onto the skin once a week., Disp: 3 patch, Rfl: 12 .  RETIN-A 0.025 % cream, APPLY TOPICALLY AT BEDTIME. TO AFFECTED SKIN, FOR ACNE, Disp: , Rfl:  .  metroNIDAZOLE (FLAGYL) 500 MG tablet, Take 1 tablet (500 mg total) by mouth 2 (two) times daily., Disp: 14 tablet, Rfl: 0  Allergies  Allergen Reactions  . Bactrim [Sulfamethoxazole-Trimethoprim]  Rash    Rash, dizzy, nausea    I personally reviewed active problem list, medication list, allergies with the patient/caregiver today.  ROS  Ten systems reviewed and is negative except as mentioned in HPI  Objective  Virtual encounter, vitals not obtained.  There is no height or weight on file to calculate BMI.  Nursing Note and Vital Signs reviewed.  Physical Exam  Constitutional: Patient appears well-developed and well-nourished. No distress.  HENT: Head: Normocephalic and atraumatic.  Neck: Normal range of motion. Pulmonary/Chest: Effort normal. No respiratory distress. Speaking in complete sentences; hoarse voice, cough present throughout visit. Neurological: Pt is alert and oriented to person, place, and time. Coordination, speech are normal.  Psychiatric: Patient has a normal mood and affect. behavior is normal. Judgment and thought content normal.  No results found for this or any previous visit (from the past 72 hour(s)).  Assessment & Plan  1. Respiratory illness - benzonatate (TESSALON PERLES) 100 MG capsule; Take 1 capsule (100 mg total) by mouth 3 (three) times daily as needed.  Dispense: 20 capsule; Refill: 0 - guaiFENesin (MUCINEX) 600 MG 12 hr tablet; Take 1 tablet (600 mg total) by mouth 2 (two) times daily as needed for cough or to loosen phlegm.  Dispense: 30 tablet; Refill: 0 - fluticasone (FLONASE) 50 MCG/ACT nasal spray; Place 1 spray into both nostrils daily.  Dispense: 16 g; Refill: 1 - ibuprofen (ADVIL) 600 MG tablet; Take 1 tablet (  600 mg total) by mouth every 8 (eight) hours as needed for fever, headache or moderate pain.  Dispense: 30 tablet; Refill: 0 - MYCHART COVID-19 HOME MONITORING PROGRAM - MyChart Temperature FLOWSHEET; Future - Novel Coronavirus, NAA ([facility]) - Symptom management; wrote her out of work for 7 days (10 days total from start of symptoms)  -Red flags and when to present for emergency care or RTC including fever >101.21F, chest  pain, shortness of breath, new/worsening/un-resolving symptoms, reviewed with patient at time of visit. Follow up and care instructions discussed and provided in AVS. - I discussed the assessment and treatment plan with the patient. The patient was provided an opportunity to ask questions and all were answered. The patient agreed with the plan and demonstrated an understanding of the instructions.  I provided 12 minutes of non-face-to-face time during this encounter.  Hubbard Hartshorn, FNP

## 2019-09-06 LAB — NOVEL CORONAVIRUS, NAA: SARS-CoV-2, NAA: NOT DETECTED

## 2019-11-01 ENCOUNTER — Ambulatory Visit: Payer: Self-pay | Admitting: *Deleted

## 2019-11-01 NOTE — Telephone Encounter (Signed)
   Reason for Disposition . Nursing judgment  Protocols used: NO GUIDELINE OR REFERENCE AVAILABLE-A-AH  I returned pt's call.  She removed her birth control patch last week for her period to come.   She forgot to put a new one on.   She had unprotected sex.   She took a Plan B.   The instructions on the package is she is not to be on any birth control.    She is wondering what to do?   Do I go ahead and  Put a patch on or not?     I let her know I did not know the answer to this question so I am going to forward this message to Raelyn Ensign, FNP and have someone call the pt back with further instructions.  The pt was agreeable to this.  Her number is [phone].

## 2019-11-04 NOTE — Telephone Encounter (Signed)
FYI

## 2019-11-04 NOTE — Telephone Encounter (Signed)
Please obtain clearer timeline - when did she remove patch, when did she apply new patch, when did she take Plan B?  Do not have any unprotected intercourse at this time.

## 2019-11-06 NOTE — Telephone Encounter (Signed)
Took patch off on November 30th. Plan B on December 2nd. New patch on December 4th. Has been sexually active since with know issues

## 2019-11-06 NOTE — Telephone Encounter (Signed)
Continue patch. No further action.

## 2019-11-07 NOTE — Telephone Encounter (Signed)
Patient notified

## 2019-12-10 ENCOUNTER — Ambulatory Visit (INDEPENDENT_AMBULATORY_CARE_PROVIDER_SITE_OTHER): Payer: Medicaid Other | Admitting: Family Medicine

## 2019-12-10 ENCOUNTER — Encounter: Payer: Self-pay | Admitting: Family Medicine

## 2019-12-10 ENCOUNTER — Other Ambulatory Visit: Payer: Self-pay

## 2019-12-10 VITALS — BP 120/70 | HR 110 | Temp 97.9°F | Resp 18 | Ht 68.0 in | Wt 153.6 lb

## 2019-12-10 DIAGNOSIS — L700 Acne vulgaris: Secondary | ICD-10-CM

## 2019-12-10 MED ORDER — CLINDAMYCIN PHOS-BENZOYL PEROX 1-5 % EX GEL: Freq: Two times a day (BID) | CUTANEOUS | 0 refills | Status: DC

## 2019-12-10 NOTE — Progress Notes (Signed)
Name: Casey Garza   MRN: [id number]    DOB: [birth year]   Date:12/10/2019       Progress Note  Subjective  Chief Complaint  Chief Complaint  Patient presents with   Acne   Follow-up    HPI  Acne: Using Clear Pore (Benzoyl Peroxide 3.5%), Neut[employer]a [address] Dove exfoliating soap; also using retinol cream PRN.  Worsening outbreaks over the last month or so.  She has dermatology in the past, has not taken oral medication for her acne in the past.   Patient Active Problem List   Diagnosis Date Noted   Dysmenorrhea 02/25/2019   Vitamin D deficiency disease    Acne     Social History   Tobacco Use   Smoking status: Never Smoker   Smokeless tobacco: Never Used  Substance Use Topics   Alcohol use: No     Current Outpatient Medications:    norelgestromin-ethinyl estradiol (ORTHO EVRA) 150-35 MCG/24HR transdermal patch, Place 1 patch onto the skin once a week., Disp: 3 patch, Rfl: 12   benzonatate (TESSALON PERLES) 100 MG capsule, Take 1 capsule (100 mg total) by mouth 3 (three) times daily as needed. (Patient not taking: Reported on 12/10/2019), Disp: 20 capsule, Rfl: 0   fluticasone (FLONASE) 50 MCG/ACT nasal spray, Place 1 spray into both nostrils daily. (Patient not taking: Reported on 12/10/2019), Disp: 16 g, Rfl: 1   guaiFENesin (MUCINEX) 600 MG 12 hr tablet, Take 1 tablet (600 mg total) by mouth 2 (two) times daily as needed for cough or to loosen phlegm. (Patient not taking: Reported on 12/10/2019), Disp: 30 tablet, Rfl: 0   ibuprofen (ADVIL) 600 MG tablet, Take 1 tablet (600 mg total) by mouth every 8 (eight) hours as needed for fever, headache or moderate pain. (Patient not taking: Reported on 12/10/2019), Disp: 30 tablet, Rfl: 0   metroNIDAZOLE (FLAGYL) 500 MG tablet, Take 1 tablet (500 mg total) by mouth 2 (two) times daily. (Patient not taking: Reported on 12/10/2019), Disp: 14 tablet, Rfl: 0   RETIN-A 0.025 % cream, APPLY TOPICALLY AT BEDTIME. TO AFFECTED  SKIN, FOR ACNE, Disp: , Rfl:   Allergies  Allergen Reactions   Bactrim [Sulfamethoxazole-Trimethoprim] Rash    Rash, dizzy, nausea    I personally reviewed active problem list, medication list, allergies, notes from last encounter with the patient/caregiver today.  ROS  Ten systems reviewed and is negative except as mentioned in HPI  Objective  Vitals:   12/10/19 1011  BP: 120/70  Pulse: (!) 110  Resp: 18  Temp: 97.9 F (36.6 C)  TempSrc: Temporal  SpO2: 96%  Weight: 153 lb 9.6 oz (69.7 kg)  Height: 5\' 8"  (1.727 m)   She notes she is nervous because she has drug test for employment after this - auscultated heart rate at rest is about 94bpm  Body mass index is 23.35 kg/m.  Nursing Note and Vital Signs reviewed.  Physical Exam  Constitutional: Patient appears well-developed and well-nourished. No distress.  HENT: Head: Normocephalic and atraumatic.  Eyes: Conjunctivae and EOM are normal. No scleral icterus.  Neck: Normal range of motion. Neck supple. No JVD present.  Cardiovascular: Normal rate, regular rhythm and normal heart sounds.  No murmur heard. No BLE edema. Pulmonary/Chest: Effort normal and breath sounds normal. No respiratory distress. Musculoskeletal: Normal range of motion, no joint effusions. No gross deformities Neurological: Pt is alert and oriented to person, place, and time. No cranial nerve deficit. Coordination, balance, strength, speech and gait are  normal.  Skin: Acne vulgaris scattered on forehead, right cheek, central chin.  No signs or symptoms of secondary infection present. Psychiatric: Patient has a normal mood and affect. behavior is normal. Judgment and thought content normal.   No results found for this or any previous visit (from the past 72 hour(s)).  Assessment & Plan  1. Acne vulgaris - clindamycin-benzoyl peroxide (BENZACLIN) gel; Apply topically 2 (two) times daily.  Dispense: 25 g; Refill: 0 - See AVS for details.

## 2019-12-10 NOTE — Patient Instructions (Addendum)
1. Cetaphil Oily - face wash - may want to try this as alternative to the Neutragena bar 2. Dove Exfoliating Soap 3. Retinol Cream in the morning 4. Benzaclin (cream that I'm sending in today) in the evenings

## 2019-12-23 ENCOUNTER — Telehealth: Payer: Self-pay | Admitting: Family Medicine

## 2019-12-23 NOTE — Telephone Encounter (Signed)
Pt states the pharmacy advised Medicaid will NOT pay for the clindamycin-benzoyl peroxide (BENZACLIN) gel  Pt needs an alternate Rx.  [id number] [place], [place] - [address] Phone:  [phone]  Fax:  [phone]

## 2019-12-25 ENCOUNTER — Other Ambulatory Visit: Payer: Self-pay | Admitting: Certified Nurse Midwife

## 2019-12-25 NOTE — Telephone Encounter (Signed)
Working on [place]. This medication is on Medicaid preferred list but after speaking to [facility] they stated it was kicking out of system

## 2019-12-26 ENCOUNTER — Other Ambulatory Visit: Payer: Self-pay | Admitting: Family Medicine

## 2019-12-26 ENCOUNTER — Telehealth: Payer: Self-pay | Admitting: Certified Nurse Midwife

## 2019-12-26 DIAGNOSIS — L700 Acne vulgaris: Secondary | ICD-10-CM

## 2019-12-26 MED ORDER — CLINDAMYCIN PHOS-BENZOYL PEROX 1.2-5 % EX GEL: 1.0000 "application " | Freq: Every day | CUTANEOUS | 0 refills | Status: DC

## 2019-12-26 MED ORDER — BENZOYL PEROXIDE-ERYTHROMYCIN 5-3 % EX GEL: Freq: Every day | CUTANEOUS | 0 refills | Status: DC

## 2019-12-26 NOTE — Telephone Encounter (Signed)
Pt called in and stated that she needs a refill on her BC patch  xulane . Please advise

## 2019-12-26 NOTE — Telephone Encounter (Signed)
Called and spoke with patient.  Aware prescription was sent in yesterday and advised to schedule appointment for additional refills.  Patient verbalized understanding but declined scheduling appointment at this time and will call back.  Patient thinking of getting future birth control prescription through PCP, has AE scheduled in July with PCP.

## 2019-12-31 ENCOUNTER — Other Ambulatory Visit: Payer: Self-pay

## 2019-12-31 ENCOUNTER — Emergency Department
Admission: EM | Admit: 2019-12-31 | Discharge: 2019-12-31 | Disposition: A | Discharge status: Home / Self Care | Payer: Medicaid Other | Attending: Emergency Medicine | Admitting: Emergency Medicine

## 2019-12-31 ENCOUNTER — Emergency Department: Payer: Medicaid Other

## 2019-12-31 DIAGNOSIS — S40012A Contusion of left shoulder, initial encounter: Secondary | ICD-10-CM | POA: Insufficient documentation

## 2019-12-31 DIAGNOSIS — M542 Cervicalgia: Secondary | ICD-10-CM | POA: Diagnosis not present

## 2019-12-31 DIAGNOSIS — S39012A Strain of muscle, fascia and tendon of lower back, initial encounter: Secondary | ICD-10-CM | POA: Insufficient documentation

## 2019-12-31 DIAGNOSIS — Y999 Unspecified external cause status: Secondary | ICD-10-CM | POA: Insufficient documentation

## 2019-12-31 DIAGNOSIS — S7002XA Contusion of left hip, initial encounter: Secondary | ICD-10-CM | POA: Insufficient documentation

## 2019-12-31 DIAGNOSIS — S161XXA Strain of muscle, fascia and tendon at neck level, initial encounter: Secondary | ICD-10-CM | POA: Diagnosis not present

## 2019-12-31 DIAGNOSIS — M25512 Pain in left shoulder: Secondary | ICD-10-CM | POA: Diagnosis not present

## 2019-12-31 DIAGNOSIS — S79912A Unspecified injury of left hip, initial encounter: Secondary | ICD-10-CM | POA: Diagnosis not present

## 2019-12-31 DIAGNOSIS — Y92410 Unspecified street and highway as the place of occurrence of the external cause: Secondary | ICD-10-CM | POA: Insufficient documentation

## 2019-12-31 DIAGNOSIS — Y9389 Activity, other specified: Secondary | ICD-10-CM | POA: Insufficient documentation

## 2019-12-31 DIAGNOSIS — S4992XA Unspecified injury of left shoulder and upper arm, initial encounter: Secondary | ICD-10-CM | POA: Diagnosis not present

## 2019-12-31 DIAGNOSIS — S3992XA Unspecified injury of lower back, initial encounter: Secondary | ICD-10-CM | POA: Diagnosis not present

## 2019-12-31 DIAGNOSIS — S199XXA Unspecified injury of neck, initial encounter: Secondary | ICD-10-CM | POA: Diagnosis present

## 2019-12-31 MED ORDER — MELOXICAM 15 MG PO TABS: 15.0000 mg | ORAL_TABLET | Freq: Every day | ORAL | 0 refills | Status: DC

## 2019-12-31 MED ORDER — METHOCARBAMOL 500 MG PO TABS: 500.0000 mg | ORAL_TABLET | Freq: Four times a day (QID) | ORAL | 0 refills | Status: DC

## 2019-12-31 MED ORDER — KETOROLAC TROMETHAMINE 30 MG/ML IJ SOLN
30.0000 mg | Freq: Once | INTRAMUSCULAR | Status: AC
  Administered 2019-12-31: 30 mg via INTRAMUSCULAR
  Filled 2019-12-31: qty 1

## 2019-12-31 MED ORDER — ORPHENADRINE CITRATE 30 MG/ML IJ SOLN
60.0000 mg | Freq: Once | INTRAMUSCULAR | Status: AC
  Administered 2019-12-31: 20:00:00 60 mg via INTRAMUSCULAR
  Filled 2019-12-31: qty 2
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
## 2020-01-01 LAB — POCT PREGNANCY, URINE: Preg Test, Ur: NEGATIVE

## 2020-01-21 ENCOUNTER — Other Ambulatory Visit: Payer: Self-pay | Admitting: Family Medicine

## 2020-01-21 DIAGNOSIS — L700 Acne vulgaris: Secondary | ICD-10-CM

## 2020-02-12 ENCOUNTER — Ambulatory Visit: Payer: Medicaid Other | Admitting: Family Medicine

## 2020-02-28 ENCOUNTER — Other Ambulatory Visit: Payer: Self-pay

## 2020-02-28 ENCOUNTER — Ambulatory Visit (INDEPENDENT_AMBULATORY_CARE_PROVIDER_SITE_OTHER): Payer: Medicaid Other | Admitting: Family Medicine

## 2020-02-28 ENCOUNTER — Encounter: Payer: Self-pay | Admitting: Family Medicine

## 2020-02-28 VITALS — BP 118/72 | HR 77 | Temp 98.3°F | Resp 14 | Ht 68.0 in | Wt 158.7 lb

## 2020-02-28 DIAGNOSIS — L700 Acne vulgaris: Secondary | ICD-10-CM

## 2020-02-28 DIAGNOSIS — Z3045 Encounter for surveillance of transdermal patch hormonal contraceptive device: Secondary | ICD-10-CM | POA: Diagnosis not present

## 2020-02-28 MED ORDER — XULANE 150-35 MCG/24HR TD PTWK: MEDICATED_PATCH | TRANSDERMAL | 3 refills | Status: DC

## 2020-02-28 MED ORDER — RETIN-A 0.025 % EX CREA: TOPICAL_CREAM | CUTANEOUS | 0 refills | Status: DC

## 2020-02-28 NOTE — Patient Instructions (Signed)
Once a day Retin-A Once a day benzyl peroxide in face wash or topical (over the counter)  I'll send in an antibiotic for acne  Follow up with dermatology  Keep the same birth control patch  Schedule your physical in the next 6 months

## 2020-02-28 NOTE — Progress Notes (Signed)
Patient ID: Casey Garza, female    DOB: 10-Sep-2001, 19 y.o.   MRN: [id number]  PCP: Hubbard Hartshorn, FNP  Chief Complaint  Patient presents with  . Medication Refill    Birth control  . Referral    derm for acne    Subjective:   Casey Garza is a 19 y.o. female, presents to clinic with CC of the following:  Patient new to me, PCP is on maternity leave  Patient presents for refill on birth control she also requests a referral to dermatology for acne vulgaris which her PCP has been treating. Patient states that she has been on the same birth control for many years.  Was last prescribed by Dr. Marcelline Mates and she was advised to come in for an office visit to get a refill however she states that she will have to pay out-of-pocket for this and so she is come here for the refill instead.   Period well controlled currently Same female partner for more than a year, she is up-to-date on her annual physical and has no concerns for STDs.  She has had no elevated blood pressure in the past.  Acne-currently on topical medications patient would like to go see Dr. Evorn Gong at [facility] dermatology because her brother went there and got a pill that made his skin significantly improve.  Patient is using Cetaphil facial cleansers and moisturizers.  She is on Retin-A and benzyl peroxide erythromycin gel.    Patient Active Problem List   Diagnosis Date Noted  . Dysmenorrhea 02/25/2019  . Vitamin D deficiency disease   . Acne       Current Outpatient Medications:  .  benzoyl peroxide-erythromycin (BENZAMYCIN) gel, Apply topically at bedtime., Disp: 23.3 g, Rfl: 0 .  meloxicam (MOBIC) 15 MG tablet, Take 1 tablet (15 mg total) by mouth daily., Disp: 30 tablet, Rfl: 0 .  methocarbamol (ROBAXIN) 500 MG tablet, Take 1 tablet (500 mg total) by mouth 4 (four) times daily., Disp: 16 tablet, Rfl: 0 .  RETIN-A 0.025 % cream, APPLY TOPICALLY AT BEDTIME. TO AFFECTED SKIN, FOR ACNE, Disp: , Rfl:  .  XULANE 150-35  MCG/24HR transdermal patch, APPLY 1 PATCH ONCE A WEEK, Disp: 3 patch, Rfl: 3   Allergies  Allergen Reactions  . Bactrim [Sulfamethoxazole-Trimethoprim] Rash    Rash, dizzy, nausea     Family History  Problem Relation Age of Onset  . Diabetes Maternal Grandmother   . Hypertension Maternal Grandmother   . Breast cancer Neg Hx   . Ovarian cancer Neg Hx   . Colon cancer Neg Hx      Social History   Socioeconomic History  . Marital status: Single    Spouse name: Not on file  . Number of children: Not on file  . Years of education: 35  . Highest education level: High school graduate  Occupational History  . Occupation: [employer]  Tobacco Use  . Smoking status: Never Smoker  . Smokeless tobacco: Never Used  Substance and Sexual Activity  . Alcohol use: No  . Drug use: Not Currently  . Sexual activity: Yes    Birth control/protection: Patch  Other Topics Concern  . Not on file  Social History Narrative  . Not on file   Social Determinants of Health   Financial Resource Strain: Low Risk   . Difficulty of Paying Living Expenses: Not hard at all  Food Insecurity: No Food Insecurity  . Worried About Charity fundraiser  in the Last Year: Never true  . Ran Out of Food in the Last Year: Never true  Transportation Needs: No Transportation Needs  . Lack of Transportation (Medical): No  . Lack of Transportation (Non-Medical): No  Physical Activity: Inactive  . Days of Exercise per Week: 0 days  . Minutes of Exercise per Session: 0 min  Stress: No Stress Concern Present  . Feeling of Stress : Not at all  Social Connections: Moderately Isolated  . Frequency of Communication with Friends and Family: More than three times a week  . Frequency of Social Gatherings with Friends and Family: Twice a week  . Attends Religious Services: Never  . Active Member of Clubs or Organizations: No  . Attends Banker Meetings: Never  . Marital Status: Never married    Intimate Partner Violence: Unknown  . Fear of Current or Ex-Partner: Patient refused  . Emotionally Abused: Patient refused  . Physically Abused: Patient refused  . Sexually Abused: Patient refused    Chart Review Today: I personally reviewed active problem list, medication list, allergies, family history, social history, health maintenance, notes from last encounter, lab results, imaging with the patient/caregiver today.  Review of Systems 10 Systems reviewed and are negative for acute change except as noted in the HPI.     Objective:   Vitals:   02/28/20 1033  BP: 118/72  Pulse: 77  Resp: 14  Temp: 98.3 F (36.8 C)  SpO2: 98%  Weight: 158 lb 11.2 oz (72 kg)  Height: 5\' 8"  (1.727 m)    Body mass index is 24.13 kg/m.  Physical Exam Vitals and nursing note reviewed.  Constitutional:      General: She is not in acute distress.    Appearance: Normal appearance. She is well-developed. She is not ill-appearing, toxic-appearing or diaphoretic.  HENT:     Head: Normocephalic and atraumatic.     Right Ear: External ear normal.     Left Ear: External ear normal.  Eyes:     General:        Right eye: No discharge.        Left eye: No discharge.     Conjunctiva/sclera: Conjunctivae normal.  Neck:     Trachea: No tracheal deviation.  Cardiovascular:     Rate and Rhythm: Normal rate and regular rhythm.     Pulses: Normal pulses.     Heart sounds: Normal heart sounds. No murmur. No friction rub. No gallop.   Pulmonary:     Effort: Pulmonary effort is normal. No respiratory distress.     Breath sounds: Normal breath sounds. No stridor.  Musculoskeletal:        General: Normal range of motion.     Right lower leg: No edema.     Left lower leg: No edema.  Skin:    General: Skin is warm and dry.     Findings: No rash.     Comments: Scattered acne to forehead some inflamed and very erythematous  Neurological:     Mental Status: She is alert.     Motor: No abnormal muscle  tone.     Coordination: Coordination normal.  Psychiatric:        Mood and Affect: Mood normal.        Behavior: Behavior normal.      Results for orders placed or performed during the hospital encounter of 12/31/19  Pregnancy, urine POC  Result Value Ref Range   Preg Test, Ur NEGATIVE NEGATIVE  Assessment & Plan:      ICD-10-CM   1. Acne vulgaris  L70.0 Ambulatory referral to Dermatology  2. Encounter for surveillance of transdermal patch hormonal contraceptive device  Z30.45 norelgestromin-ethinyl estradiol [employer]r [employer]) 150-35 MCG/24HR transdermal patch    Patient is up-to-date on all of her wellness, not due for Pap, STDs were recently done she is low risk with one sexual partner for the past year and a half.  Discussed safe sex practices. Refills on the birth control patch were put through to the pharmacy.  I am not sure about her insurance coverage and told her to discuss this with the front office.  She will likely not have coverage for dermatology and specialist evaluation and appointments if she has only family-planning.  Encouraged her to schedule for her next complete physical, if dermatology care is limited by financial barriers and insurance I did tell her that we could try some other birth control it would be also indicated for acne but that she will likely has been regular periods for the first several months.    Danelle Berry, PA-C 02/28/20 11:00 AM

## 2020-03-20 ENCOUNTER — Encounter: Payer: Self-pay | Admitting: Family Medicine

## 2020-03-31 ENCOUNTER — Encounter: Payer: Self-pay | Admitting: Family Medicine

## 2020-04-13 ENCOUNTER — Other Ambulatory Visit: Payer: Self-pay

## 2020-04-13 ENCOUNTER — Encounter: Payer: Self-pay | Admitting: Family Medicine

## 2020-04-13 ENCOUNTER — Ambulatory Visit (INDEPENDENT_AMBULATORY_CARE_PROVIDER_SITE_OTHER): Payer: Medicaid Other | Admitting: Family Medicine

## 2020-04-13 VITALS — BP 118/82 | HR 100 | Temp 97.1°F | Resp 18 | Ht 68.0 in | Wt 156.9 lb

## 2020-04-13 DIAGNOSIS — N898 Other specified noninflammatory disorders of vagina: Secondary | ICD-10-CM

## 2020-04-13 NOTE — Progress Notes (Signed)
Patient ID: Casey Garza, female    DOB: 2000/11/30, 19 y.o.   MRN: [id number]  PCP: Danelle Berry, PA-C  Chief Complaint  Patient presents with  . Vaginitis    discuss rucurrent vaginitis and BV    Subjective:   Casey Garza is a 19 y.o. female, presents to clinic with CC of the following:  Vaginal Discharge The patient's primary symptoms include a genital odor and vaginal discharge. The patient's pertinent negatives include no genital itching, genital lesions, genital rash, missed menses, pelvic pain or vaginal bleeding. This is a recurrent problem. The current episode started more than 1 year ago. Episode frequency: recurrent 1-2 times a month. The problem has been unchanged. The patient is experiencing no pain. Pertinent negatives include no abdominal pain, chills, constipation, diarrhea, discolored urine, dysuria, fever, flank pain, frequency, headaches, hematuria, joint pain, joint swelling, painful intercourse, urgency or vomiting. Vaginal discharge characteristics: thicker white sometimes and thinner and odorous. She has not been passing clots. She has not been passing tissue. Nothing aggravates the symptoms. She has tried antibiotics (flagyl) for the symptoms. The treatment provided significant relief. She is sexually active (one partner). No, her partner does not have an STD. She uses a patch for contraception. Her menstrual history has been regular. Her past medical history is significant for vaginosis. There is no history of an STD.      Patient Active Problem List   Diagnosis Date Noted  . Dysmenorrhea 02/25/2019  . Vitamin D deficiency disease   . Acne       Current Outpatient Medications:  .  norelgestromin-ethinyl estradiol (XULANE) 150-35 MCG/24HR transdermal patch, APPLY 1 PATCH ONCE A WEEK, Disp: 3 patch, Rfl: 3 .  RETIN-A 0.025 % cream, APPLY TOPICALLY AT BEDTIME. TO AFFECTED SKIN, FOR ACNE, Disp: 45 g, Rfl: 0 .  benzoyl peroxide-erythromycin (BENZAMYCIN) gel,  Apply topically at bedtime. (Patient not taking: Reported on 04/13/2020), Disp: 23.3 g, Rfl: 0 .  meloxicam (MOBIC) 15 MG tablet, Take 1 tablet (15 mg total) by mouth daily. (Patient not taking: Reported on 04/13/2020), Disp: 30 tablet, Rfl: 0 .  methocarbamol (ROBAXIN) 500 MG tablet, Take 1 tablet (500 mg total) by mouth 4 (four) times daily. (Patient not taking: Reported on 04/13/2020), Disp: 16 tablet, Rfl: 0   Allergies  Allergen Reactions  . Bactrim [Sulfamethoxazole-Trimethoprim] Rash    Rash, dizzy, nausea     Family History  Problem Relation Age of Onset  . Diabetes Maternal Grandmother   . Hypertension Maternal Grandmother   . Breast cancer Neg Hx   . Ovarian cancer Neg Hx   . Colon cancer Neg Hx      Social History   Socioeconomic History  . Marital status: Single    Spouse name: Not on file  . Number of children: Not on file  . Years of education: 75  . Highest education level: High school graduate  Occupational History  . Occupation: [employer]  Tobacco Use  . Smoking status: Never Smoker  . Smokeless tobacco: Never Used  Substance and Sexual Activity  . Alcohol use: No  . Drug use: Not Currently  . Sexual activity: Yes    Birth control/protection: Patch  Other Topics Concern  . Not on file  Social History Narrative  . Not on file   Social Determinants of Health   Financial Resource Strain: Low Risk   . Difficulty of Paying Living Expenses: Not hard at all  Food Insecurity: No Food Insecurity  .  Worried About Charity fundraiser in the Last Year: Never true  . Ran Out of Food in the Last Year: Never true  Transportation Needs: No Transportation Needs  . Lack of Transportation (Medical): No  . Lack of Transportation (Non-Medical): No  Physical Activity: Inactive  . Days of Exercise per Week: 0 days  . Minutes of Exercise per Session: 0 min  Stress: No Stress Concern Present  . Feeling of Stress : Not at all  Social Connections: Moderately  Isolated  . Frequency of Communication with Friends and Family: More than three times a week  . Frequency of Social Gatherings with Friends and Family: Twice a week  . Attends Religious Services: Never  . Active Member of Clubs or Organizations: No  . Attends Archivist Meetings: Never  . Marital Status: Never married  Intimate Partner Violence: Unknown  . Fear of Current or Ex-Partner: Patient refused  . Emotionally Abused: Patient refused  . Physically Abused: Patient refused  . Sexually Abused: Patient refused    Chart Review Today: I personally reviewed active problem list, medication list, allergies, family history, social history, health maintenance, notes from last encounter, lab results, imaging with the patient/caregiver today. Reviewed with patient her several lab results over the past year.  Review of Systems  Constitutional: Negative.  Negative for chills and fever.  HENT: Negative.   Eyes: Negative.   Respiratory: Negative.   Cardiovascular: Negative.   Gastrointestinal: Negative.  Negative for abdominal pain, constipation, diarrhea and vomiting.  Endocrine: Negative.   Genitourinary: Positive for vaginal discharge. Negative for dysuria, flank pain, frequency, hematuria, missed menses, pelvic pain and urgency.  Musculoskeletal: Negative.  Negative for joint pain.  Skin: Negative.   Allergic/Immunologic: Negative.   Neurological: Negative.  Negative for headaches.  Hematological: Negative.   Psychiatric/Behavioral: Negative.   All other systems reviewed and are negative.      Objective:   Vitals:   04/13/20 1325  BP: 118/82  Pulse: 100  Resp: 18  Temp: (!) 97.1 F (36.2 C)  TempSrc: Temporal  SpO2: 98%  Weight: 156 lb 14.4 oz (71.2 kg)  Height: 5\' 8"  (1.727 m)    Body mass index is 23.86 kg/m.  Physical Exam Vitals and nursing note reviewed.  Constitutional:      Appearance: She is well-developed.  HENT:     Head: Normocephalic and  atraumatic.     Nose: Nose normal.  Eyes:     General:        Right eye: No discharge.        Left eye: No discharge.     Conjunctiva/sclera: Conjunctivae normal.  Neck:     Trachea: No tracheal deviation.  Cardiovascular:     Rate and Rhythm: Normal rate and regular rhythm.  Pulmonary:     Effort: Pulmonary effort is normal. No respiratory distress.     Breath sounds: No stridor.  Abdominal:     General: Bowel sounds are normal. There is no distension.     Palpations: Abdomen is soft.     Tenderness: There is no abdominal tenderness. There is no right CVA tenderness, left CVA tenderness or guarding.  Musculoskeletal:        General: Normal range of motion.  Skin:    General: Skin is warm and dry.     Findings: No rash.  Neurological:     Mental Status: She is alert.     Motor: No abnormal muscle tone.     Coordination:  Coordination normal.  Psychiatric:        Mood and Affect: Mood normal.        Behavior: Behavior normal.      Results for orders placed or performed during the hospital encounter of 12/31/19  Pregnancy, urine POC  Result Value Ref Range   Preg Test, Ur NEGATIVE NEGATIVE        Assessment & Plan:      ICD-10-CM   1. Vaginal discharge  N89.8 Cervicovaginal ancillary only     Patient presents today for concerns of recurrent vaginal discharge with odor.  She is currently on her menses is not currently having symptoms.  She is sexually active, on birth control, 1 female partner, does not use condoms, over the past year has had recurrent vaginal discharge that is increased from her normal, odorous, without consistent vaginitis symptoms does not voice feel irritated or have itching or burning.  The discharge is coming more frequently seems to come close to her periods.  She reports that she did not start birth control when the symptoms started was on birth control prior to that.  She has had testing several times over the past year has had BV and been treated  for it but she is concerned that it keeps reoccurring.  I encouraged her to return and do vaginal swab testing when her symptoms return.  Would want to rule out STDs although she is slightly lower risk and does not suspect that her partner has any other sexual partners.  She has no history of STDs either. I did give her handouts and pronounce regarding normal vaginal discharge and we discussed physiological vaginal discharge, changes in discharge with cycles.  Encouraged her to take opportunities to air out at night, avoid soaps and detergents with bathing, avoid tight fitting clothing and wearing cotton undergarments.  Encouraged her to try probiotic.  Currently will not treat anything but patient was encouraged to return for vaginal swab testing if and when she has symptomatic.  If there are no positive findings with retesting or she continues to have recurrent symptoms we will refer her to a GYN.  I did encourage her to read the materials given.  Most women will have vaginal discharge and as long as there are not concerning symptoms such as pelvic pain, dysuria, or vulvovaginal itching burning swelling or rash, a lot of vaginal discharge is normal and physiological, and does not require treatment.   Danelle Berry, PA-C 04/13/20 1:37 PM

## 2020-04-13 NOTE — Patient Instructions (Signed)
Return when symptomatic for vaginal swab/testing   If we cannot improve your symptoms - we'll get you to a specialists  Try airing out, avoiding soaps, avoid tight clothes etc.  Probiotic - culturelle is brand name but ask your pharmacist for a recommendation

## 2020-04-20 ENCOUNTER — Telehealth: Payer: Self-pay | Admitting: Family Medicine

## 2020-04-20 NOTE — Telephone Encounter (Signed)
Copied from CRM [id number]. Topic: General - Other >> Apr 20, 2020  4:35 PM Tamela Oddi wrote: Reason for CRM: Patient called to schedule an appt. Fairly soon.  Checked schedule and there were no appts. Available.  Patient stated that the doctor wanted to run some tests after her menstrual cycle and that is why she was calling to get an appt. Soon.  Patient would like to know if she can be worked in by doctor.  Please advise and call patient if this is possible.  CB# [phone]

## 2020-04-21 NOTE — Telephone Encounter (Signed)
Is there an opening at 10 on 6/1? Place her there

## 2020-04-28 ENCOUNTER — Other Ambulatory Visit: Payer: Self-pay

## 2020-04-28 ENCOUNTER — Encounter: Payer: Self-pay | Admitting: Family Medicine

## 2020-04-28 ENCOUNTER — Ambulatory Visit (INDEPENDENT_AMBULATORY_CARE_PROVIDER_SITE_OTHER): Payer: Medicaid Other | Admitting: Family Medicine

## 2020-04-28 ENCOUNTER — Other Ambulatory Visit (HOSPITAL_COMMUNITY)
Admission: RE | Admit: 2020-04-28 | Discharge: 2020-04-28 | Disposition: A | Discharge status: Home / Self Care | Payer: Medicaid Other | Source: Ambulatory Visit | Attending: Family Medicine | Admitting: Family Medicine

## 2020-04-28 ENCOUNTER — Other Ambulatory Visit: Payer: Self-pay | Admitting: Family Medicine

## 2020-04-28 VITALS — BP 112/72 | HR 94 | Temp 97.3°F | Resp 16 | Ht 68.0 in | Wt 156.3 lb

## 2020-04-28 DIAGNOSIS — E538 Deficiency of other specified B group vitamins: Secondary | ICD-10-CM

## 2020-04-28 DIAGNOSIS — E559 Vitamin D deficiency, unspecified: Secondary | ICD-10-CM | POA: Diagnosis not present

## 2020-04-28 DIAGNOSIS — N898 Other specified noninflammatory disorders of vagina: Secondary | ICD-10-CM | POA: Diagnosis not present

## 2020-04-28 MED ORDER — CYANOCOBALAMIN 1000 MCG/ML IJ SOLN
1000.0000 ug | Freq: Once | INTRAMUSCULAR | Status: AC
  Administered 2020-04-28: 1000 ug via INTRAMUSCULAR

## 2020-04-28 MED ORDER — VITAMIN D (ERGOCALCIFEROL) 1.25 MG (50000 UNIT) PO CAPS: 50000.0000 [IU] | ORAL_CAPSULE | ORAL | 0 refills | Status: DC

## 2020-04-28 NOTE — Progress Notes (Signed)
Name: Casey Garza   MRN: [id number]    DOB: 09-26-2001   Date:04/28/2020       Progress Note  Subjective  Chief Complaint  Chief Complaint  Patient presents with  . Vaginitis    white discharge, yeast    HPI  Recurrent vaginitis: she was diagnosed by [facility] last August with BV, she states she recurrent thick vaginal discharge that starts monthly one day after her period and lasts for about one week. It has an odor, no pain or itching. We will check for GC, BV and trichomonas, if positive for BV we will treat with one round of metronidazole and send rx clindagel to use the week after her cycle . No fever , chills, abdominal pain, dysuria, no change in sexual partners  B12 and Vitamin D deficiency: very low last year, only taking supplements occasionally, we will send rx, she is returning this month for a cpe and should have labs rechecked during that visit. She states her hands are always cold, no fatigue.   Patient Active Problem List   Diagnosis Date Noted  . Dysmenorrhea 02/25/2019  . Vitamin D deficiency disease   . Acne     Social History   Tobacco Use  . Smoking status: Never Smoker  . Smokeless tobacco: Never Used  Substance Use Topics  . Alcohol use: No     Current Outpatient Medications:  .  norelgestromin-ethinyl estradiol (XULANE) 150-35 MCG/24HR transdermal patch, APPLY 1 PATCH ONCE A WEEK, Disp: 3 patch, Rfl: 3 .  RETIN-A 0.025 % cream, APPLY TOPICALLY AT BEDTIME. TO AFFECTED SKIN, FOR ACNE, Disp: 45 g, Rfl: 0 .  Vitamin D, Ergocalciferol, (DRISDOL) 1.25 MG (50000 UNIT) CAPS capsule, Take 1 capsule (50,000 Units total) by mouth every 7 (seven) days., Disp: 12 capsule, Rfl: 0  Current Facility-Administered Medications:  .  cyanocobalamin ((VITAMIN B-12)) injection 1,000 mcg, 1,000 mcg, Intramuscular, Once, Alba Cory, MD  Allergies  Allergen Reactions  . Bactrim [Sulfamethoxazole-Trimethoprim] Rash    Rash, dizzy, nausea    ROS  Ten systems  reviewed and is negative except as mentioned in HPI  Objective  Vitals:   04/28/20 0951  BP: 112/72  Pulse: 94  Resp: 16  Temp: (!) 97.3 F (36.3 C)  TempSrc: Temporal  SpO2: 100%  Weight: 156 lb 4.8 oz (70.9 kg)  Height: 5\' 8"  (1.727 m)    Body mass index is 23.77 kg/m.    Physical Exam  Constitutional: Patient appears well-developed and well-nourished.  No distress.  HEENT: head atraumatic, normocephalic, pupils equal and reactive to light,neck supple Cardiovascular: Normal rate, regular rhythm and normal heart sounds.  No murmur heard. No BLE edema. Pulmonary/Chest: Effort normal and breath sounds normal. No respiratory distress. Abdominal: Soft.  There is no tenderness. Psychiatric: Patient has a normal mood and affect. behavior is normal. Judgment and thought content normal.  Assessment & Plan   1. Vaginal discharge  - Cervicovaginal ancillary only  2. B12 deficiency  - cyanocobalamin ((VITAMIN B-12)) injection 1,000 mcg  3. Vitamin D deficiency  - Vitamin D, Ergocalciferol, (DRISDOL) 1.25 MG (50000 UNIT) CAPS capsule; Take 1 capsule (50,000 Units total) by mouth every 7 (seven) days.  Dispense: 12 capsule; Refill: 0

## 2020-04-29 LAB — CERVICOVAGINAL ANCILLARY ONLY
Bacterial Vaginitis (gardnerella): POSITIVE — AB
Candida Glabrata: NEGATIVE
Candida Vaginitis: POSITIVE — AB
Chlamydia: NEGATIVE
Comment: NEGATIVE
Comment: NEGATIVE
Comment: NEGATIVE
Comment: NEGATIVE
Comment: NORMAL
Neisseria Gonorrhea: NEGATIVE

## 2020-04-30 ENCOUNTER — Other Ambulatory Visit: Payer: Self-pay | Admitting: Family Medicine

## 2020-04-30 ENCOUNTER — Encounter: Payer: Self-pay | Admitting: Family Medicine

## 2020-04-30 DIAGNOSIS — N760 Acute vaginitis: Secondary | ICD-10-CM

## 2020-04-30 MED ORDER — METRONIDAZOLE 0.75 % VA GEL: 1.0000 | Freq: Two times a day (BID) | VAGINAL | 2 refills | Status: DC

## 2020-06-06 ENCOUNTER — Other Ambulatory Visit: Payer: Self-pay | Admitting: Family Medicine

## 2020-06-15 ENCOUNTER — Other Ambulatory Visit (HOSPITAL_COMMUNITY)
Admission: RE | Admit: 2020-06-15 | Discharge: 2020-06-15 | Disposition: A | Discharge status: Home / Self Care | Payer: Medicaid Other | Source: Ambulatory Visit | Attending: Family Medicine | Admitting: Family Medicine

## 2020-06-15 ENCOUNTER — Ambulatory Visit (INDEPENDENT_AMBULATORY_CARE_PROVIDER_SITE_OTHER): Payer: Medicaid Other | Admitting: Family Medicine

## 2020-06-15 ENCOUNTER — Other Ambulatory Visit: Payer: Self-pay

## 2020-06-15 ENCOUNTER — Encounter: Payer: Self-pay | Admitting: Family Medicine

## 2020-06-15 VITALS — BP 118/78 | HR 98 | Temp 97.9°F | Resp 16 | Ht 68.0 in | Wt 155.7 lb

## 2020-06-15 DIAGNOSIS — Z113 Encounter for screening for infections with a predominantly sexual mode of transmission: Secondary | ICD-10-CM | POA: Insufficient documentation

## 2020-06-15 DIAGNOSIS — Z7251 High risk heterosexual behavior: Secondary | ICD-10-CM

## 2020-06-15 DIAGNOSIS — R300 Dysuria: Secondary | ICD-10-CM | POA: Diagnosis not present

## 2020-06-15 DIAGNOSIS — N761 Subacute and chronic vaginitis: Secondary | ICD-10-CM | POA: Diagnosis not present

## 2020-06-15 DIAGNOSIS — Z0000 Encounter for general adult medical examination without abnormal findings: Secondary | ICD-10-CM

## 2020-06-15 LAB — POCT URINALYSIS DIPSTICK
Bilirubin, UA: NEGATIVE
Glucose, UA: NEGATIVE
Ketones, UA: 160
Nitrite, UA: NEGATIVE
Protein, UA: POSITIVE — AB
Spec Grav, UA: 1.015 (ref 1.010–1.025)
Urobilinogen, UA: 0.2 E.U./dL
pH, UA: 5 (ref 5.0–8.0)

## 2020-06-15 LAB — POCT URINE PREGNANCY: Preg Test, Ur: NEGATIVE

## 2020-06-15 MED ORDER — FLUCONAZOLE 150 MG PO TABS: 150.0000 mg | ORAL_TABLET | ORAL | 0 refills | Status: DC | PRN

## 2020-06-15 MED ORDER — NITROFURANTOIN MONOHYD MACRO 100 MG PO CAPS: 100.0000 mg | ORAL_CAPSULE | Freq: Two times a day (BID) | ORAL | 0 refills | Status: DC

## 2020-06-15 NOTE — Progress Notes (Signed)
Adolescent Well Care Visit Casey Garza is a 19 y.o. female who is here for well care.    PCP:  Danelle Berry, PA-C   History was provided by the patient.  Confidentiality was discussed with the patient and, if applicable, with caregiver as well. Patient's personal or confidential phone number:    Current Issues: Current concerns include - none right now, previously discussed vaginal discharge and acne  She wants std testing She is sexually active with birth control but no condom use - irritates her She has continued vaginitis and hematuria with dysuria and urine frequncy  Nutrition: Nutrition/Eating Behaviors:  Pretty balanced diet, eats what she wants Adequate calcium in diet?:  Yes, doesn't drink milk - has with cereal Supplements/ Vitamins: none, Vit D  Exercise/ Media: Play any Sports?/ Exercise: none - only at work Screen Time:  > 2 hours-counseling provided  Sleep:  Sleep:  6 hours - goes to bed 1 am up at 7-8 am  Social Screening: Lives with:  Mom and brother   Parental relations:  good Stressors of note: no  Working: Chartered loss adjuster - full time  Education:   Fall August School Name:  [employer] She is interested in Administrator and business administration  Menstruation:   Patient's last menstrual period was 06/08/2020. Menstrual History:  Patch - regular break through bleeding   Confidential Social History: Tobacco?  no Secondhand smoke exposure?  no Drugs/ETOH?  no  Sexually Active?  yes   Pregnancy Prevention: patch, not using condoms - see top of note  Safe at home, in school & in relationships?  Yes Safe to self?  Yes   Screenings: Patient has a dental home: yes  Reviewed and discussed anticipatory guidance on the following - those not addressed verbally today were included in AVS handout:   healthy eating, exercise, seatbelt use, bullying, abuse/trauma, weapon use, tobacco use, marijuana use, drug use, condom use, birth control, sexuality,  suicidality/self harm, mental health issues, social isolation, school problems, family problems and screen time    PHQ-9 completed and results indicated no depression, reviewed and neg today Depression screen [facility] 2/9 06/15/2020 04/28/2020 04/13/2020  Decreased Interest 0 0 0  Down, Depressed, Hopeless 0 0 0  PHQ - 2 Score 0 0 0  Altered sleeping 0 0 0  Tired, decreased energy 0 0 0  Change in appetite 0 0 0  Feeling bad or failure about yourself  0 0 0  Trouble concentrating 0 0 0  Moving slowly or fidgety/restless 0 0 0  Suicidal thoughts 0 0 0  PHQ-9 Score 0 0 0  Difficult doing work/chores Not difficult at all Not difficult at all Not difficult at all  Some recent data might be hidden     Physical Exam:  Vitals:   06/15/20 1045  BP: 118/78  Pulse: 98  Resp: 16  Temp: 97.9 F (36.6 C)  TempSrc: Temporal  SpO2: 100%  Weight: 155 lb 11.2 oz (70.6 kg)  Height: 5\' 8"  (1.727 m)   BP 118/78   Pulse 98   Temp 97.9 F (36.6 C) (Temporal)   Resp 16   Ht 5\' 8"  (1.727 m)   Wt 155 lb 11.2 oz (70.6 kg)   LMP 06/08/2020   SpO2 100%   BMI 23.67 kg/m  Body mass index: body mass index is 23.67 kg/m. Blood pressure percentiles are not available for patients who are 18 years or older.  No exam data present  General Appearance:  alert, oriented, no acute distress  HENT: Normocephalic, no obvious abnormality, conjunctiva clear  Mouth:   mask  Neck:   Supple; thyroid: no enlargement, symmetric, no tenderness/mass/nodules  Chest Not examined- declined by pt  Lungs:   Clear to auscultation bilaterally, normal work of breathing  Heart:   Regular rate and rhythm, S1 and S2 normal, no murmurs;   Abdomen:   Soft, non-tender, non-distended, no CVA tenderness b/l,  no mass, or organomegaly  GU Declined examination  Musculoskeletal:   Tone and strength strong and symmetrical, all extremities               Lymphatic:   No cervical adenopathy  Skin/Hair/Nails:   Skin warm, dry and  intact, no rashes, no bruises or petechiae  Neurologic:   Strength, gait, and coordination normal and age-appropriate     Assessment and Plan:     ICD-10-CM   1. Encounter for general adult medical examination without abnormal findings  Z00.00   2. Subacute vaginitis  N76.1    recurrent discharge and irritation for months, recent tests + for yeast and BV, pt did not treat per Dr. Carlynn Purl instructions, recheck and tx positives  3. Screening examination for STD (sexually transmitted disease)  Z11.3 HIV Antibody (routine testing w rflx)    POCT urine pregnancy    POCT urinalysis dipstick    RPR    Urine Culture    Cervicovaginal ancillary only  4. Dysuria  R30.0 POCT urine pregnancy    POCT urinalysis dipstick    Urine Culture   tx for suspected UTI, diflucan dose x 2 with abx due to recent yeast vaginitis  5. High risk sexual behavior, unspecified type  Z72.51    discussed barrier methods to prevent STDs, testing today, encouraged her current partner to get tested as well   BMI is appropriate for age  Recent BV - will rx tx following abx and diflucan if vaginal swab is positive, discussed vaginal suppository tx BID x 7 days then maintenance with twice weekly tx    Return in 6 months (on 12/16/2020) for f/up vaginitis sx.Danelle Berry, PA-C

## 2020-06-15 NOTE — Patient Instructions (Addendum)
I will send in meds to treat BV and yeast if the tests are still positive WE will call you to go over the meds and treatment.  Safe Sex Practicing safe sex means taking steps before and during sex to reduce your risk of:  Getting an STI (sexually transmitted infection).  Giving your partner an STI.  Unwanted or unplanned pregnancy. How can I practice safe sex?     Ways you can practice safe sex  Limit your sexual partners to only one partner who is having sex with only you.  Avoid using alcohol and drugs before having sex. Alcohol and drugs can affect your judgment.  Before having sex with a new partner: ? Talk to your partner about past partners, past STIs, and drug use. ? Get screened for STIs and discuss the results with your partner. Ask your partner to get screened, too.  Check your body regularly for sores, blisters, rashes, or unusual discharge. If you notice any of these problems, visit your health care provider.  Avoid sexual contact if you have symptoms of an infection or you are being treated for an STI.  While having sex, use a condom. Make sure to: ? Use a condom every time you have vaginal, oral, or anal sex. Both females and males should wear condoms during oral sex. ? Keep condoms in place from the beginning to the end of sexual activity. ? Use a latex condom, if possible. Latex condoms offer the best protection. ? Use only water-based lubricants with a condom. Using petroleum-based lubricants or oils will weaken the condom and increase the chance that it will break. Ways your health care provider can help you practice safe sex  See your health care provider for regular screenings, exams, and tests for STIs.  Talk with your health care provider about what kind of birth control (contraception) is best for you.  Get vaccinated against hepatitis B and human papillomavirus (HPV).  If you are at risk of being infected with HIV (human immunodeficiency virus), talk  with your health care provider about taking a prescription medicine to prevent HIV infection. You are at risk for HIV if you: ? Are a man who has sex with other men. ? Are sexually active with more than one partner. ? Take drugs by injection. ? Have a sex partner who has HIV. ? Have unprotected sex. ? Have sex with someone who has sex with both men and women. ? Have had an STI. Follow these instructions at home:  Take over-the-counter and prescription medicines as told by your health care provider.  Keep all follow-up visits as told by your health care provider. This is important. Where to find more information  Centers for Disease Control and Prevention: PinkCheek.be  Planned Parenthood: https://www.plannedparenthood.org/  Office on Women's Health: BasketballVoice.it Summary  Practicing safe sex means taking steps before and during sex to reduce your risk of STIs, giving your partner STIs, and having an unwanted or unplanned pregnancy.  Before having sex with a new partner, talk to your partner about past partners, past STIs, and drug use.  Use a condom every time you have vaginal, oral, or anal sex. Both females and males should wear condoms during oral sex.  Check your body regularly for sores, blisters, rashes, or unusual discharge. If you notice any of these problems, visit your health care provider.  See your health care provider for regular screenings, exams, and tests for STIs. This information is not intended to replace advice given to  you by your health care provider. Make sure you discuss any questions you have with your health care provider. Document Revised: 03/08/2019 Document Reviewed: 08/27/2018 Elsevier Patient Education  2020 [place] 20-51 Years Old, Female Preventive care refers to lifestyle choices and visits with your health care provider that can  promote health and wellness. At this stage in your life, you may start seeing a primary care physician instead of a pediatrician. Your health care is now your responsibility. Preventive care for young adults includes:  A yearly physical exam. This is also called an annual wellness visit.  Regular dental and eye exams.  Immunizations.  Screening for certain conditions.  Healthy lifestyle choices, such as diet and exercise. What can I expect for my preventive care visit? Physical exam Your health care provider may check:  Height and weight. These may be used to calculate body mass index (BMI), which is a measurement that tells if you are at a healthy weight.  Heart rate and blood pressure.  Body temperature. Counseling Your health care provider may ask you questions about:  Past medical problems and family medical history.  Alcohol, tobacco, and drug use.  Home and relationship well-being.  Access to firearms.  Emotional well-being.  Diet, exercise, and sleep habits.  Sexual activity and sexual health.  Method of birth control.  Menstrual cycle.  Pregnancy history. What immunizations do I need?  Influenza (flu) vaccine  This is recommended every year. Tetanus, diphtheria, and pertussis (Tdap) vaccine  You may need a Td booster every 10 years. Varicella (chickenpox) vaccine  You may need this vaccine if you have not already been vaccinated. Human papillomavirus (HPV) vaccine  If recommended by your health care provider, you may need three doses over 6 months. Measles, mumps, and rubella (MMR) vaccine  You may need at least one dose of MMR. You may also need a second dose. Meningococcal conjugate (MenACWY) vaccine  One dose is recommended if you are 21-52 years old and a Market researcher living in a residence hall, or if you have one of several medical conditions. You may also need additional booster doses. Pneumococcal conjugate (PCV13)  vaccine  You may need this if you have certain conditions and were not previously vaccinated. Pneumococcal polysaccharide (PPSV23) vaccine  You may need one or two doses if you smoke cigarettes or if you have certain conditions. Hepatitis A vaccine  You may need this if you have certain conditions or if you travel or work in places where you may be exposed to hepatitis A. Hepatitis B vaccine  You may need this if you have certain conditions or if you travel or work in places where you may be exposed to hepatitis B. Haemophilus influenzae type b (Hib) vaccine  You may need this if you have certain risk factors. You may receive vaccines as individual doses or as more than one vaccine together in one shot (combination vaccines). Talk with your health care provider about the risks and benefits of combination vaccines. What tests do I need? Blood tests  Lipid and cholesterol levels. These may be checked every 5 years starting at age 63.  Hepatitis C test.  Hepatitis B test. Screening  Pelvic exam and Pap test. This may be done every 3 years starting at age 37.  Sexually transmitted disease (STD) testing, if you are at risk.  BRCA-related cancer screening. This may be done if you have a family history of breast, ovarian, tubal, or peritoneal  cancers. Other tests  Tuberculosis skin test.  Vision and hearing tests.  Skin exam.  Breast exam. Follow these instructions at home: Eating and drinking   Eat a diet that includes fresh fruits and vegetables, whole grains, lean protein, and low-fat dairy products.  Drink enough fluid to keep your urine pale yellow.  Do not drink alcohol if: ? Your health care provider tells you not to drink. ? You are pregnant, may be pregnant, or are planning to become pregnant. ? You are under the legal drinking age. In the [place] the legal drinking age is 30.  If you drink alcohol: ? Limit how much you have to 0-1 drink a day. ? Be aware of how  much alcohol is in your drink. In the [place] one drink equals one 12 oz bottle of beer (355 mL), one 5 oz glass of wine (148 mL), or one 1 oz glass of hard liquor (44 mL). Lifestyle  Take daily care of your teeth and gums.  Stay active. Exercise at least 30 minutes 5 or more days of the week.  Do not use any products that contain nicotine or tobacco, such as cigarettes, e-cigarettes, and chewing tobacco. If you need help quitting, ask your health care provider.  Do not use drugs.  If you are sexually active, practice safe sex. Use a condom or other form of birth control (contraception) in order to prevent pregnancy and STIs (sexually transmitted infections). If you plan to become pregnant, see your health care provider for a pre-conception visit.  Find healthy ways to cope with stress, such as: ? Meditation, yoga, or listening to music. ? Journaling. ? Talking to a trusted person. ? Spending time with friends and family. Safety  Always wear your seat belt while driving or riding in a vehicle.  Do not drive if you have been drinking alcohol. Do not ride with someone who has been drinking.  Do not drive when you are tired or distracted. Do not text while driving.  Wear a helmet and other protective equipment during sports activities.  If you have firearms in your house, make sure you follow all gun safety procedures.  Seek help if you have been bullied, physically abused, or sexually abused.  Use the Internet responsibly to avoid dangers such as online bullying and online sex predators. What's next?  Go to your health care provider once a year for a well check visit.  Ask your health care provider how often you should have your eyes and teeth checked.  Stay up to date on all vaccines. This information is not intended to replace advice given to you by your health care provider. Make sure you discuss any questions you have with your health care provider. Document Revised:  11/08/2018 Document Reviewed: 11/08/2018 Elsevier Patient Education  2020 [employer].

## 2020-06-16 ENCOUNTER — Other Ambulatory Visit: Payer: Self-pay | Admitting: Family Medicine

## 2020-06-16 DIAGNOSIS — N760 Acute vaginitis: Secondary | ICD-10-CM

## 2020-06-16 LAB — CERVICOVAGINAL ANCILLARY ONLY
Bacterial Vaginitis (gardnerella): POSITIVE — AB
Candida Glabrata: NEGATIVE
Candida Vaginitis: NEGATIVE
Chlamydia: NEGATIVE
Comment: NEGATIVE
Comment: NEGATIVE
Comment: NEGATIVE
Comment: NEGATIVE
Comment: NEGATIVE
Comment: NORMAL
Neisseria Gonorrhea: NEGATIVE
Trichomonas: NEGATIVE

## 2020-06-16 MED ORDER — METRONIDAZOLE 0.75 % VA GEL: 1.0000 | Freq: Two times a day (BID) | VAGINAL | 0 refills | Status: AC

## 2020-06-16 MED ORDER — METRONIDAZOLE 0.75 % VA GEL: 1.0000 | VAGINAL | 1 refills | Status: DC

## 2020-06-17 ENCOUNTER — Other Ambulatory Visit: Payer: Self-pay | Admitting: Family Medicine

## 2020-06-17 LAB — URINE CULTURE
MICRO NUMBER:: 10722255
SPECIMEN QUALITY:: ADEQUATE

## 2020-06-17 LAB — RPR: RPR Ser Ql: NONREACTIVE

## 2020-06-17 LAB — HIV ANTIBODY (ROUTINE TESTING W REFLEX): HIV 1&2 Ab, 4th Generation: NONREACTIVE

## 2020-06-17 MED ORDER — CEPHALEXIN 750 MG PO CAPS: 750.0000 mg | ORAL_CAPSULE | Freq: Three times a day (TID) | ORAL | 0 refills | Status: AC

## 2020-06-18 NOTE — Telephone Encounter (Signed)
This encounter was created in error - please disregard.

## 2020-07-06 ENCOUNTER — Other Ambulatory Visit: Payer: Self-pay | Admitting: Family Medicine

## 2020-07-06 DIAGNOSIS — E559 Vitamin D deficiency, unspecified: Secondary | ICD-10-CM

## 2020-07-07 ENCOUNTER — Telehealth: Payer: Self-pay | Admitting: Family Medicine

## 2020-07-07 ENCOUNTER — Other Ambulatory Visit: Payer: Self-pay | Admitting: Family Medicine

## 2020-07-07 DIAGNOSIS — E559 Vitamin D deficiency, unspecified: Secondary | ICD-10-CM

## 2020-07-07 DIAGNOSIS — L700 Acne vulgaris: Secondary | ICD-10-CM | POA: Diagnosis not present

## 2020-07-07 NOTE — Telephone Encounter (Signed)
Medication Refill - Medication: Vitamin D  Has the patient contacted their pharmacy? Yes.   Didn't get request. (Agent: If no, request that the patient contact the pharmacy for the refill.) (Agent: If yes, when and what did the pharmacy advise?)  Preferred Pharmacy (with phone number or street name): [facility] [address]  Agent: Please be advised that RX refills may take up to 3 business days. We ask that you follow-up with your pharmacy.

## 2020-07-07 NOTE — Telephone Encounter (Signed)
This med was d/c'd 719/21 by provider. Last Vitamin D level 01/27/2018 :16 ng/ml. Routing to provider.

## 2020-07-27 ENCOUNTER — Other Ambulatory Visit: Payer: Self-pay | Admitting: Family Medicine

## 2020-07-27 DIAGNOSIS — B9689 Other specified bacterial agents as the cause of diseases classified elsewhere: Secondary | ICD-10-CM

## 2020-08-05 ENCOUNTER — Other Ambulatory Visit: Payer: Self-pay | Admitting: Family Medicine

## 2020-08-05 DIAGNOSIS — Z3045 Encounter for surveillance of transdermal patch hormonal contraceptive device: Secondary | ICD-10-CM

## 2020-09-21 DIAGNOSIS — S9031XA Contusion of right foot, initial encounter: Secondary | ICD-10-CM | POA: Diagnosis not present

## 2020-09-21 DIAGNOSIS — M79671 Pain in right foot: Secondary | ICD-10-CM | POA: Diagnosis not present

## 2020-10-06 ENCOUNTER — Other Ambulatory Visit: Payer: Self-pay

## 2020-10-06 ENCOUNTER — Ambulatory Visit: Payer: Medicaid Other | Admitting: Family Medicine

## 2020-10-06 ENCOUNTER — Encounter: Payer: Self-pay | Admitting: Family Medicine

## 2020-10-06 VITALS — BP 110/82 | HR 98 | Temp 98.5°F | Resp 18 | Ht 68.0 in | Wt 156.3 lb

## 2020-10-06 DIAGNOSIS — Z3201 Encounter for pregnancy test, result positive: Secondary | ICD-10-CM | POA: Diagnosis not present

## 2020-10-06 LAB — POCT URINE PREGNANCY: Preg Test, Ur: POSITIVE — AB

## 2020-10-06 MED ORDER — DOXYLAMINE-PYRIDOXINE 10-10 MG PO TBEC: 1.0000 | DELAYED_RELEASE_TABLET | Freq: Every evening | ORAL | 2 refills | Status: DC | PRN

## 2020-10-06 NOTE — Patient Instructions (Signed)
Results for orders placed or performed in visit on 10/06/20  POCT urine pregnancy  Result Value Ref Range   Preg Test, Ur Positive (A) Negative      First Trimester of Pregnancy  The first trimester of pregnancy is from week 1 until the end of week 13 (months 1 through 3). During this time, your baby will begin to develop inside you. At 6-8 weeks, the eyes and face are formed, and the heartbeat can be seen on ultrasound. At the end of 12 weeks, all the baby's organs are formed. Prenatal care is all the medical care you receive before the birth of your baby. Make sure you get good prenatal care and follow all of your doctor's instructions. Follow these instructions at home: Medicines  Take over-the-counter and prescription medicines only as told by your doctor. Some medicines are safe and some medicines are not safe during pregnancy.  Take a prenatal vitamin that contains at least 600 micrograms (mcg) of folic acid.  If you have trouble pooping (constipation), take medicine that will make your stool soft (stool softener) if your doctor approves. Eating and drinking   Eat regular, healthy meals.  Your doctor will tell you the amount of weight gain that is right for you.  Avoid raw meat and uncooked cheese.  If you feel sick to your stomach (nauseous) or throw up (vomit): ? Eat 4 or 5 small meals a day instead of 3 large meals. ? Try eating a few soda crackers. ? Drink liquids between meals instead of during meals.  To prevent constipation: ? Eat foods that are high in fiber, like fresh fruits and vegetables, whole grains, and beans. ? Drink enough fluids to keep your pee (urine) clear or pale yellow. Activity  Exercise only as told by your doctor. Stop exercising if you have cramps or pain in your lower belly (abdomen) or low back.  Do not exercise if it is too hot, too humid, or if you are in a place of great height (high altitude).  Try to avoid standing for long periods of  time. Move your legs often if you must stand in one place for a long time.  Avoid heavy lifting.  Wear low-heeled shoes. Sit and stand up straight.  You can have sex unless your doctor tells you not to. Relieving pain and discomfort  Wear a good support bra if your breasts are sore.  Take warm water baths (sitz baths) to soothe pain or discomfort caused by hemorrhoids. Use hemorrhoid cream if your doctor says it is okay.  Rest with your legs raised if you have leg cramps or low back pain.  If you have puffy, bulging veins (varicose veins) in your legs: ? Wear support hose or compression stockings as told by your doctor. ? Raise (elevate) your feet for 15 minutes, 3-4 times a day. ? Limit salt in your food. Prenatal care  Schedule your prenatal visits by the twelfth week of pregnancy.  Write down your questions. Take them to your prenatal visits.  Keep all your prenatal visits as told by your doctor. This is important. Safety  Wear your seat belt at all times when driving.  Make a list of emergency phone numbers. The list should include numbers for family, friends, the hospital, and police and fire departments. General instructions  Ask your doctor for a referral to a local prenatal class. Begin classes no later than at the start of month 6 of your pregnancy.  Ask for help  if you need counseling or if you need help with nutrition. Your doctor can give you advice or tell you where to go for help.  Do not use hot tubs, steam rooms, or saunas.  Do not douche or use tampons or scented sanitary pads.  Do not cross your legs for long periods of time.  Avoid all herbs and alcohol. Avoid drugs that are not approved by your doctor.  Do not use any tobacco products, including cigarettes, chewing tobacco, and electronic cigarettes. If you need help quitting, ask your doctor. You may get counseling or other support to help you quit.  Avoid cat litter boxes and soil used by cats.  These carry germs that can cause birth defects in the baby and can cause a loss of your baby (miscarriage) or stillbirth.  Visit your dentist. At home, brush your teeth with a soft toothbrush. Be gentle when you floss. Contact a doctor if:  You are dizzy.  You have mild cramps or pressure in your lower belly.  You have a nagging pain in your belly area.  You continue to feel sick to your stomach, you throw up, or you have watery poop (diarrhea).  You have a bad smelling fluid coming from your vagina.  You have pain when you pee (urinate).  You have increased puffiness (swelling) in your face, hands, legs, or ankles. Get help right away if:  You have a fever.  You are leaking fluid from your vagina.  You have spotting or bleeding from your vagina.  You have very bad belly cramping or pain.  You gain or lose weight rapidly.  You throw up blood. It may look like coffee grounds.  You are around people who have [place] measles, fifth disease, or chickenpox.  You have a very bad headache.  You have shortness of breath.  You have any kind of trauma, such as from a fall or a car accident. Summary  The first trimester of pregnancy is from week 1 until the end of week 13 (months 1 through 3).  To take care of yourself and your unborn baby, you will need to eat healthy meals, take medicines only if your doctor tells you to do so, and do activities that are safe for you and your baby.  Keep all follow-up visits as told by your doctor. This is important as your doctor will have to ensure that your baby is healthy and growing well. This information is not intended to replace advice given to you by your health care provider. Make sure you discuss any questions you have with your health care provider. Document Revised: 03/07/2019 Document Reviewed: 11/22/2016 Elsevier Patient Education  2020 [employer].

## 2020-10-06 NOTE — Progress Notes (Signed)
Patient ID: Casey Garza, female    DOB: 2001/07/08, 19 y.o.   MRN: [id number]  PCP: Danelle Berry, PA-C  Chief Complaint  Patient presents with  . Referral    positive pregnancy test    Subjective:   Casey Garza is a 19 y.o. female, presents to clinic with CC of the following:  HPI  Patient presents with a missed menses, she is on the patch and is sexually active.  She expected to have her period the end of September.  She did not have her normal menses then but does believe that she had some spotting or bleeding around October 16.  In the past week she has taken about half a dozen home pregnancy tests which have been positive, she has experienced some low suprapubic area cramping but not had any vaginal bleeding.  She denies any nausea or vomiting.  She has been extremely fatigued and tired working more than 40 hours and going to school full-time as well.  She is here for a pregnancy test and referral to OB/GYN.  LMP estimated to be 9/26th Some spotting possibly around 10/16  Urine preg done here today, no past pregnancies or deliveries. Results for orders placed or performed in visit on 10/06/20  POCT urine pregnancy  Result Value Ref Range   Preg Test, Ur Positive (A) Negative   G1P0 She was offered the flu shot today but is not feeling very well and is extremely tired and would prefer to not get it right now.  She has also been having headaches which she does not have history of, she has been trying to rest more and take Tylenol and avoid ibuprofen.      Patient Active Problem List   Diagnosis Date Noted  . Dysmenorrhea 02/25/2019  . Vitamin D deficiency disease   . Acne       Current Outpatient Medications:  .  fluconazole (DIFLUCAN) 150 MG tablet, Take 1 tablet (150 mg total) by mouth every 3 (three) days as needed (for vaginal itching/yeast infection sx). (Patient not taking: Reported on 10/06/2020), Disp: 2 tablet, Rfl: 0 .  metroNIDAZOLE (METROGEL VAGINAL)  0.75 % vaginal gel, Place 1 Applicatorful vaginally 2 (two) times a week. (Patient not taking: Reported on 10/06/2020), Disp: 70 g, Rfl: 1 .  RETIN-A 0.025 % cream, APPLY TOPICALLY AT BEDTIME. TO AFFECTED SKIN, FOR ACNE (Patient not taking: Reported on 10/06/2020), Disp: 45 g, Rfl: 0 .  Vitamin D, Ergocalciferol, (DRISDOL) 1.25 MG (50000 UNIT) CAPS capsule, TAKE 1 CAPSULE (50,000 UNITS TOTAL) BY MOUTH EVERY 7 (SEVEN) DAYS. (Patient not taking: Reported on 10/06/2020), Disp: 12 capsule, Rfl: 0 .  XULANE 150-35 MCG/24HR transdermal patch, APPLY 1 PATCH ONCE A WEEK, Disp: 3 patch, Rfl: 3   Allergies  Allergen Reactions  . Bactrim [Sulfamethoxazole-Trimethoprim] Rash    Rash, dizzy, nausea     Social History   Tobacco Use  . Smoking status: Never Smoker  . Smokeless tobacco: Never Used  Vaping Use  . Vaping Use: Former  Substance Use Topics  . Alcohol use: No  . Drug use: Not Currently      Chart Review Today: I personally reviewed active problem list, medication list, allergies, family history, social history, health maintenance, notes from last encounter, lab results, imaging with the patient/caregiver today.   Review of Systems 10 Systems reviewed and are negative for acute change except as noted in the HPI.      Objective:   Vitals:  10/06/20 1441  BP: 110/82  Pulse: 98  Resp: 18  Temp: 98.5 F (36.9 C)  TempSrc: Oral  SpO2: 98%  Weight: 156 lb 4.8 oz (70.9 kg)  Height: 5\' 8"  (1.727 m)    Body mass index is 23.77 kg/m.  Physical Exam Vitals and nursing note reviewed.  Constitutional:      General: She is not in acute distress.    Appearance: Normal appearance. She is well-developed. She is not ill-appearing, toxic-appearing or diaphoretic.     Comments: Patient is well-appearing but looks tired, pleasant, alert  HENT:     Head: Normocephalic and atraumatic.     Right Ear: External ear normal.     Left Ear: External ear normal.     Nose: Nose normal.  Eyes:      General:        Right eye: No discharge.        Left eye: No discharge.     Conjunctiva/sclera: Conjunctivae normal.  Neck:     Trachea: No tracheal deviation.  Cardiovascular:     Rate and Rhythm: Normal rate and regular rhythm.  Pulmonary:     Effort: Pulmonary effort is normal. No respiratory distress.     Breath sounds: No stridor.  Musculoskeletal:        General: Normal range of motion.  Skin:    General: Skin is warm and dry.     Findings: No rash.  Neurological:     Mental Status: She is alert. Mental status is at baseline.     Motor: No abnormal muscle tone.     Coordination: Coordination normal.  Psychiatric:        Mood and Affect: Mood normal.        Behavior: Behavior normal.      Results for orders placed or performed in visit on 10/06/20  POCT urine pregnancy  Result Value Ref Range   Preg Test, Ur Positive (A) Negative       Assessment & Plan:     ICD-10-CM   1. Pregnancy test positive  Z32.01 POCT urine pregnancy    Ambulatory referral to Obstetrics / Gynecology    Doxylamine-Pyridoxine (DICLEGIS) 10-10 MG TBEC   LMP 8/31, expected last menses 9/26 to 28th - but no menses -  EDD roughly early June? To June 7th  I reviewed with her first trimester pregnancy care encouraged her to start prenatals, discussed likely just which she can take if she experiences first semester nausea and/or vomiting discussed dosing with her Encouraged her to stay well rested and hydrated and to use Tylenol and avoid NSAIDs  Handout given with additional information  Referral to OB/GYN   11-07-1970, PA-C 10/06/20 3:00 PM

## 2020-10-28 ENCOUNTER — Encounter: Payer: Medicaid Other | Admitting: Obstetrics and Gynecology

## 2020-11-01 ENCOUNTER — Encounter: Payer: Self-pay | Admitting: Family Medicine

## 2020-11-02 ENCOUNTER — Other Ambulatory Visit: Payer: Self-pay

## 2020-11-02 ENCOUNTER — Emergency Department
Admission: EM | Admit: 2020-11-02 | Discharge: 2020-11-02 | Disposition: A | Discharge status: Home / Self Care | Payer: Medicaid Other | Attending: Student in an Organized Health Care Education/Training Program | Admitting: Student in an Organized Health Care Education/Training Program

## 2020-11-02 DIAGNOSIS — R439 Unspecified disturbances of smell and taste: Secondary | ICD-10-CM | POA: Diagnosis present

## 2020-11-02 DIAGNOSIS — O98511 Other viral diseases complicating pregnancy, first trimester: Secondary | ICD-10-CM | POA: Diagnosis not present

## 2020-11-02 DIAGNOSIS — Z3A01 Less than 8 weeks gestation of pregnancy: Secondary | ICD-10-CM | POA: Diagnosis not present

## 2020-11-02 DIAGNOSIS — U071 COVID-19: Secondary | ICD-10-CM | POA: Insufficient documentation

## 2020-11-02 DIAGNOSIS — Z20822 Contact with and (suspected) exposure to covid-19: Secondary | ICD-10-CM

## 2020-11-02 DIAGNOSIS — Z3A08 8 weeks gestation of pregnancy: Secondary | ICD-10-CM | POA: Diagnosis not present

## 2020-11-02 LAB — RESP PANEL BY RT-PCR (FLU A&B, COVID) ARPGX2
Influenza A by PCR: NEGATIVE
Influenza B by PCR: NEGATIVE
SARS Coronavirus 2 by RT PCR: POSITIVE — AB

## 2020-11-02 NOTE — ED Triage Notes (Signed)
Pt here for a covid test. Pt [redacted] weeks pregnant and wants to make sure she is ok. Pt NAD in triage.

## 2020-11-02 NOTE — Discharge Instructions (Signed)
Your Covid test should result later today Take over-the-counter vitamin C, zinc, and Mucinex, over-the-counter Tylenol for fever as needed, drink plenty of fluids Return to the emergency department worsening If your test is positive you should remain quarantined for 10 to 14 days. If your test is negative you may return to your daily activities when your symptoms are improving

## 2020-11-02 NOTE — ED Provider Notes (Signed)
[facility] Emergency Department Provider Note  ____________________________________________   First MD Initiated Contact with Patient 11/02/20 1634     (approximate)  I have reviewed the triage vital signs and the nursing notes.   HISTORY  Chief Complaint Covid Exposure    HPI Casey Garza is a 19 y.o. female who is [redacted] weeks pregnant presents emergency department complaining of Covid-like symptoms.  Patient states 2 days ago she lost her sense of taste and smell.  She has had some cold symptoms along with body aches.  States her sister also has Covid.  She is continue to work with her symptoms.  Has an appointment with her OB for later this week.  She denies chest pain or shortness of breath.  No vaginal bleeding.    Past Medical History:  Diagnosis Date  . Acne   . Vitamin D deficiency disease     Patient Active Problem List   Diagnosis Date Noted  . Dysmenorrhea 02/25/2019  . Vitamin D deficiency disease   . Acne     No past surgical history on file.  Prior to Admission medications   Medication Sig Start Date End Date Taking? Authorizing Provider  Doxylamine-Pyridoxine (DICLEGIS) 10-10 MG TBEC Take 1 tablet by mouth at bedtime as needed (for N/V). 10/06/20   Danelle Berry, PA-C    Allergies Bactrim [sulfamethoxazole-trimethoprim]  Family History  Problem Relation Age of Onset  . Diabetes Maternal Grandmother   . Hypertension Maternal Grandmother   . Breast cancer Neg Hx   . Ovarian cancer Neg Hx   . Colon cancer Neg Hx     Social History Social History   Tobacco Use  . Smoking status: Never Smoker  . Smokeless tobacco: Never Used  Vaping Use  . Vaping Use: Former  Substance Use Topics  . Alcohol use: No  . Drug use: Not Currently    Review of Systems  Constitutional: No fever/chills, positive body aches Eyes: No visual changes. ENT: No sore throat. Respiratory: Positive cough Cardiovascular: Denies chest  pain Gastrointestinal: Denies abdominal pain Genitourinary: Negative for dysuria. Musculoskeletal: Negative for back pain. Skin: Negative for rash. Psychiatric: no mood changes,     ____________________________________________   PHYSICAL EXAM:  VITAL SIGNS: ED Triage Vitals [11/02/20 1531]  Enc Vitals Group     BP 136/74     Pulse Rate 94     Resp 18     Temp 98.6 F (37 C)     Temp Source Oral     SpO2 97 %     Weight 156 lb 4.9 oz (70.9 kg)     Height 5\' 8"  (1.727 m)     Head Circumference      Peak Flow      Pain Score 0     Pain Loc      Pain Edu?      Excl. in GC?     Constitutional: Alert and oriented. Well appearing and in no acute distress. Eyes: Conjunctivae are normal.  Head: Atraumatic. Nose: No congestion/rhinnorhea. Mouth/Throat: Mucous membranes are moist.   Neck:  supple no lymphadenopathy noted Cardiovascular: Normal rate, regular rhythm. Heart sounds are normal Respiratory: Normal respiratory effort.  No retractions, lungs c t a  Abd: soft nontender bs normal all 4 quad GU: deferred Musculoskeletal: FROM all extremities, warm and well perfused Neurologic:  Normal speech and language.  Skin:  Skin is warm, dry and intact. No rash noted. Psychiatric: Mood and affect are normal.  Speech and behavior are normal.  ____________________________________________   LABS (all labs ordered are listed, but only abnormal results are displayed)  Labs Reviewed  RESP PANEL BY RT-PCR (FLU A&B, COVID) ARPGX2 - Abnormal; Notable for the following components:      Result Value   SARS Coronavirus 2 by RT PCR POSITIVE (*)    All other components within normal limits   ____________________________________________   ____________________________________________  RADIOLOGY    ____________________________________________   PROCEDURES  Procedure(s) performed: No  Procedures    ____________________________________________   INITIAL IMPRESSION /  ASSESSMENT AND PLAN / ED COURSE  Pertinent labs & imaging results that were available during my care of the patient were reviewed by me and considered in my medical decision making (see chart for details).   Patient is a 19 year old 4-week pregnant female presents with Covid symptoms.  See HPI.  Physical exam shows patient to have normal vitals and appears stable.  Exam is unremarked  Covid test ordered  I did explain findings to the patient.  Explained her she will need to quarantine/isolate.  If her test is negative she may return to work.  If positive she will need to continue to quarantine.  She was given a work note stating same.  She was discharged stable condition peer    ----------------------------------------- 6:19 PM on 11/02/2020 -----------------------------------------  Covid test is positive.  Did call patient to notify her.  She is to quarantine for 10 to 14 days.  Return emergency department if worsening.  Notify her GYN since she is approximately [redacted] weeks pregnant.  Casey Garza was evaluated in Emergency Department on 11/02/2020 for the symptoms described in the history of present illness. She was evaluated in the context of the global COVID-19 pandemic, which necessitated consideration that the patient might be at risk for infection with the SARS-CoV-2 virus that causes COVID-19. Institutional protocols and algorithms that pertain to the evaluation of patients at risk for COVID-19 are in a state of rapid change based on information released by regulatory bodies including the CDC and federal and state organizations. These policies and algorithms were followed during the patient's care in the ED.    As part of my medical decision making, I reviewed the following data within the electronic MEDICAL RECORD NUMBER Nursing notes reviewed and incorporated, Labs reviewed , Old chart reviewed, Notes from prior ED visits and [place] Controlled Substance  Database  ____________________________________________   FINAL CLINICAL IMPRESSION(S) / ED DIAGNOSES  Final diagnoses:  Suspected COVID-19 virus infection      NEW MEDICATIONS STARTED DURING THIS VISIT:  Discharge Medication List as of 11/02/2020  4:50 PM       Note:  This document was prepared using Dragon voice recognition software and may include unintentional dictation errors.    Faythe Ghee, PA-C 11/02/20 1819    Willy Eddy, MD 11/02/20 [phone]

## 2020-11-03 ENCOUNTER — Telehealth: Payer: Self-pay | Admitting: Physician Assistant

## 2020-11-03 ENCOUNTER — Telehealth: Payer: Self-pay

## 2020-11-03 NOTE — Telephone Encounter (Signed)
Transition Care Management Unsuccessful Follow-up Telephone Call  Date of discharge and from where:  11/02/2020 from [facility] ED  Attempts:  1st Attempt  Reason for unsuccessful TCM follow-up call:  Unable to leave message  Someone picked up and then hung up the phone.

## 2020-11-03 NOTE — Telephone Encounter (Signed)
Called to Discuss with patient about Covid symptoms and the use of the monoclonal antibody infusion for those with mild to moderate Covid symptoms and at a high risk of hospitalization.     Pt appears to qualify for this infusion due to co-morbid conditions and/or a member of an at-risk group in accordance with the FDA Emergency Use Authorization.    Unable to reach pt. Left voice mail to call back.   

## 2020-11-04 ENCOUNTER — Telehealth: Payer: Self-pay | Admitting: Emergency Medicine

## 2020-11-04 NOTE — Telephone Encounter (Signed)
Transition Care Management Unsuccessful Follow-up Telephone Call  Date of discharge and from where:  11/02/2020 from [facility] ED  Attempts:  2nd Attempt  Reason for unsuccessful TCM follow-up call:  Left voice message

## 2020-11-04 NOTE — Telephone Encounter (Signed)
Called patient to assure she is aware of covid positive.  She says she is aware and understands isolation guidelines.  I also told her that I can see in her chart where they are trying to call her about infusion.  She says she did get a call from [employer]. I told her she should call them back and they can just give her the information about the infusions.

## 2020-11-05 NOTE — Telephone Encounter (Signed)
Transition Care Management Unsuccessful Follow-up Telephone Call  Date of discharge and from where:  11/02/2020 from [facility] ED  Attempts:  3rd Attempt  Reason for unsuccessful TCM follow-up call:  Left voice message

## 2020-11-06 ENCOUNTER — Encounter: Payer: Medicaid Other | Admitting: Obstetrics and Gynecology

## 2020-11-23 ENCOUNTER — Other Ambulatory Visit (HOSPITAL_COMMUNITY)
Admission: RE | Admit: 2020-11-23 | Discharge: 2020-11-23 | Disposition: A | Discharge status: Home / Self Care | Payer: Medicaid Other | Source: Ambulatory Visit | Attending: Obstetrics and Gynecology | Admitting: Obstetrics and Gynecology

## 2020-11-23 ENCOUNTER — Ambulatory Visit (INDEPENDENT_AMBULATORY_CARE_PROVIDER_SITE_OTHER): Payer: Medicaid Other | Admitting: Obstetrics and Gynecology

## 2020-11-23 ENCOUNTER — Encounter: Payer: Self-pay | Admitting: Obstetrics and Gynecology

## 2020-11-23 ENCOUNTER — Other Ambulatory Visit: Payer: Self-pay

## 2020-11-23 VITALS — BP 126/72 | HR 93 | Wt 157.0 lb

## 2020-11-23 DIAGNOSIS — Z3149 Encounter for other procreative investigation and testing: Secondary | ICD-10-CM | POA: Diagnosis not present

## 2020-11-23 DIAGNOSIS — Z130 Encounter for screening for diseases of the blood and blood-forming organs and certain disorders involving the immune mechanism: Secondary | ICD-10-CM

## 2020-11-23 DIAGNOSIS — Z113 Encounter for screening for infections with a predominantly sexual mode of transmission: Secondary | ICD-10-CM | POA: Insufficient documentation

## 2020-11-23 DIAGNOSIS — Z7185 Encounter for immunization safety counseling: Secondary | ICD-10-CM

## 2020-11-23 DIAGNOSIS — N926 Irregular menstruation, unspecified: Secondary | ICD-10-CM

## 2020-11-23 DIAGNOSIS — Z3401 Encounter for supervision of normal first pregnancy, first trimester: Secondary | ICD-10-CM

## 2020-11-23 DIAGNOSIS — Z369 Encounter for antenatal screening, unspecified: Secondary | ICD-10-CM | POA: Diagnosis not present

## 2020-11-23 DIAGNOSIS — Z3403 Encounter for supervision of normal first pregnancy, third trimester: Secondary | ICD-10-CM | POA: Insufficient documentation

## 2020-11-23 DIAGNOSIS — Z3A10 10 weeks gestation of pregnancy: Secondary | ICD-10-CM

## 2020-11-23 NOTE — Patient Instructions (Signed)
First Trimester of Pregnancy The first trimester of pregnancy is from week 1 until the end of week 13 (months 1 through 3). A week after a sperm fertilizes an egg, the egg will implant on the wall of the uterus. This embryo will begin to develop into a baby. Genes from you and your partner will form the baby. The female genes will determine whether the baby will be a boy or a girl. At 6-8 weeks, the eyes and face will be formed, and the heartbeat can be seen on ultrasound. At the end of 12 weeks, all the baby's organs will be formed. Now that you are pregnant, you will want to do everything you can to have a healthy baby. Two of the most important things are to get good prenatal care and to follow your health care provider's instructions. Prenatal care is all the medical care you receive before the baby's birth. This care will help prevent, find, and treat any problems during the pregnancy and childbirth. Body changes during your first trimester Your body goes through many changes during pregnancy. The changes vary from woman to woman.  You may gain or lose a couple of pounds at first.  You may feel sick to your stomach (nauseous) and you may throw up (vomit). If the vomiting is uncontrollable, call your health care provider.  You may tire easily.  You may develop headaches that can be relieved by medicines. All medicines should be approved by your health care provider.  You may urinate more often. Painful urination may mean you have a bladder infection.  You may develop heartburn as a result of your pregnancy.  You may develop constipation because certain hormones are causing the muscles that push stool through your intestines to slow down.  You may develop hemorrhoids or swollen veins (varicose veins).  Your breasts may begin to grow larger and become tender. Your nipples may stick out more, and the tissue that surrounds them (areola) may become darker.  Your gums may bleed and may be  sensitive to brushing and flossing.  Dark spots or blotches (chloasma, mask of pregnancy) may develop on your face. This will likely fade after the baby is born.  Your menstrual periods will stop.  You may have a loss of appetite.  You may develop cravings for certain kinds of food.  You may have changes in your emotions from day to day, such as being excited to be pregnant or being concerned that something may go wrong with the pregnancy and baby.  You may have more vivid and strange dreams.  You may have changes in your hair. These can include thickening of your hair, rapid growth, and changes in texture. Some women also have hair loss during or after pregnancy, or hair that feels dry or thin. Your hair will most likely return to normal after your baby is born. What to expect at prenatal visits During a routine prenatal visit:  You will be weighed to make sure you and the baby are growing normally.  Your blood pressure will be taken.  Your abdomen will be measured to track your baby's growth.  The fetal heartbeat will be listened to between weeks 10 and 14 of your pregnancy.  Test results from any previous visits will be discussed. Your health care provider may ask you:  How you are feeling.  If you are feeling the baby move.  If you have had any abnormal symptoms, such as leaking fluid, bleeding, severe headaches, or abdominal   cramping.  If you are using any tobacco products, including cigarettes, chewing tobacco, and electronic cigarettes.  If you have any questions. Other tests that may be performed during your first trimester include:  Blood tests to find your blood type and to check for the presence of any previous infections. The tests will also be used to check for low iron levels (anemia) and protein on red blood cells (Rh antibodies). Depending on your risk factors, or if you previously had diabetes during pregnancy, you may have tests to check for high blood sugar  that affects pregnant women (gestational diabetes).  Urine tests to check for infections, diabetes, or protein in the urine.  An ultrasound to confirm the proper growth and development of the baby.  Fetal screens for spinal cord problems (spina bifida) and Down syndrome.  HIV (human immunodeficiency virus) testing. Routine prenatal testing includes screening for HIV, unless you choose not to have this test.  You may need other tests to make sure you and the baby are doing well. Follow these instructions at home: Medicines  Follow your health care provider's instructions regarding medicine use. Specific medicines may be either safe or unsafe to take during pregnancy.  Take a prenatal vitamin that contains at least 600 micrograms (mcg) of folic acid.  If you develop constipation, try taking a stool softener if your health care provider approves. Eating and drinking   Eat a balanced diet that includes fresh fruits and vegetables, whole grains, good sources of protein such as meat, eggs, or tofu, and low-fat dairy. Your health care provider will help you determine the amount of weight gain that is right for you.  Avoid raw meat and uncooked cheese. These carry germs that can cause birth defects in the baby.  Eating four or five small meals rather than three large meals a day may help relieve nausea and vomiting. If you start to feel nauseous, eating a few soda crackers can be helpful. Drinking liquids between meals, instead of during meals, also seems to help ease nausea and vomiting.  Limit foods that are high in fat and processed sugars, such as fried and sweet foods.  To prevent constipation: ? Eat foods that are high in fiber, such as fresh fruits and vegetables, whole grains, and beans. ? Drink enough fluid to keep your urine clear or pale yellow. Activity  Exercise only as directed by your health care provider. Most women can continue their usual exercise routine during  pregnancy. Try to exercise for 30 minutes at least 5 days a week. Exercising will help you: ? Control your weight. ? Stay in shape. ? Be prepared for labor and delivery.  Experiencing pain or cramping in the lower abdomen or lower back is a good sign that you should stop exercising. Check with your health care provider before continuing with normal exercises.  Try to avoid standing for long periods of time. Move your legs often if you must stand in one place for a long time.  Avoid heavy lifting.  Wear low-heeled shoes and practice good posture.  You may continue to have sex unless your health care provider tells you not to. Relieving pain and discomfort  Wear a good support bra to relieve breast tenderness.  Take warm sitz baths to soothe any pain or discomfort caused by hemorrhoids. Use hemorrhoid cream if your health care provider approves.  Rest with your legs elevated if you have leg cramps or low back pain.  If you develop varicose veins in   your legs, wear support hose. Elevate your feet for 15 minutes, 3-4 times a day. Limit salt in your diet. Prenatal care  Schedule your prenatal visits by the twelfth week of pregnancy. They are usually scheduled monthly at first, then more often in the last 2 months before delivery.  Write down your questions. Take them to your prenatal visits.  Keep all your prenatal visits as told by your health care provider. This is important. Safety  Wear your seat belt at all times when driving.  Make a list of emergency phone numbers, including numbers for family, friends, the hospital, and police and fire departments. General instructions  Ask your health care provider for a referral to a local prenatal education class. Begin classes no later than the beginning of month 6 of your pregnancy.  Ask for help if you have counseling or nutritional needs during pregnancy. Your health care provider can offer advice or refer you to specialists for help  with various needs.  Do not use hot tubs, steam rooms, or saunas.  Do not douche or use tampons or scented sanitary pads.  Do not cross your legs for long periods of time.  Avoid cat litter boxes and soil used by cats. These carry germs that can cause birth defects in the baby and possibly loss of the fetus by miscarriage or stillbirth.  Avoid all smoking, herbs, alcohol, and medicines not prescribed by your health care provider. Chemicals in these products affect the formation and growth of the baby.  Do not use any products that contain nicotine or tobacco, such as cigarettes and e-cigarettes. If you need help quitting, ask your health care provider. You may receive counseling support and other resources to help you quit.  Schedule a dentist appointment. At home, brush your teeth with a soft toothbrush and be gentle when you floss. Contact a health care provider if:  You have dizziness.  You have mild pelvic cramps, pelvic pressure, or nagging pain in the abdominal area.  You have persistent nausea, vomiting, or diarrhea.  You have a bad smelling vaginal discharge.  You have pain when you urinate.  You notice increased swelling in your face, hands, legs, or ankles.  You are exposed to fifth disease or chickenpox.  You are exposed to German measles (rubella) and have never had it. Get help right away if:  You have a fever.  You are leaking fluid from your vagina.  You have spotting or bleeding from your vagina.  You have severe abdominal cramping or pain.  You have rapid weight gain or loss.  You vomit blood or material that looks like coffee grounds.  You develop a severe headache.  You have shortness of breath.  You have any kind of trauma, such as from a fall or a car accident. Summary  The first trimester of pregnancy is from week 1 until the end of week 13 (months 1 through 3).  Your body goes through many changes during pregnancy. The changes vary from  woman to woman.  You will have routine prenatal visits. During those visits, your health care provider will examine you, discuss any test results you may have, and talk with you about how you are feeling. This information is not intended to replace advice given to you by your health care provider. Make sure you discuss any questions you have with your health care provider. Document Revised: 10/27/2017 Document Reviewed: 10/26/2016 [employer] Patient Education  2020 [employer]  

## 2020-11-23 NOTE — Progress Notes (Addendum)
New Obstetric Patient H&P    Chief Complaint: "Desires prenatal care"   History of Present Illness: Patient is a 19 y.o. G1P0000 Not Hispanic or Latino female, presents with amenorrhea and positive home pregnancy test. Patient's last menstrual period was 09/12/2020 (approximate). and based on her  LMP, her EDD is Estimated Date of Delivery: 06/19/21 and her EGA is [redacted]w[redacted]d. Cycles are 6-7 days, regular, and occur approximately every : 28 days.    She had a urine pregnancy test which was positive 8 week(s)  ago. Her last menstrual period was normal and lasted for  6 or 7 day(s). Since her LMP she claims she has experienced intermittent nausea. She denies vaginal bleeding. Her past medical history is noncontributory. This is her first pregnancy.  Since her LMP, she admits to the use of tobacco products  no She claims she has gained   7 pounds since the start of her pregnancy. Reviewed recommended weight gain in pregnancy. There are cats in the home in the home  no She admits close contact with children on a regular basis  yes  She has had chicken pox in the past unknown She has had Tuberculosis exposures, symptoms, or previously tested positive for TB   no Current or past history of domestic violence. no  Genetic Screening/Teratology Counseling: (Includes patient, baby's father, or anyone in either family with:)   1. Patient's age >/= 77 at [facility]  no 2. Thalassemia ([place], [place], Mediterranean, or Asian background): MCV<80  no 3. Neural tube defect (meningomyelocele, spina bifida, anencephaly)  no 4. Congenital heart defect  no  5. Down syndrome  no 6. Tay-Sachs (Jewish, [place]  no 7. Canavan's Disease  no 8. Sickle cell disease or trait (African)  no  9. Hemophilia or other blood disorders  no  10. Muscular dystrophy  no  11. Cystic fibrosis  no  12. Huntington's Chorea  no  13. Mental retardation/autism  no 14. Other inherited genetic or chromosomal disorder  no 15.  Maternal metabolic disorder (DM, PKU, etc)  no 16. Patient or FOB with a child with a birth defect not listed above no  16a. Patient or FOB with a birth defect themselves no 17. Recurrent pregnancy loss, or stillbirth  no  18. Any medications since LMP other than prenatal vitamins (include vitamins, supplements, OTC meds, drugs, alcohol)  no 19. Any other genetic/environmental exposure to discuss  no  Infection History:   1. Lives with someone with TB or TB exposed  no  2. Patient or partner has history of genital herpes  no 3. Rash or viral illness since LMP  no 4. History of STI (GC, CT, HPV, syphilis, HIV)  no 5. History of recent travel :  no  Other pertinent information:  Currently employed at [employer]. Lives with mother and brother.     Review of Systems:10 point review of systems negative unless otherwise noted in HPI  Past Medical History:  Patient Active Problem List   Diagnosis Date Noted   Encounter for supervision of normal first pregnancy in first trimester 11/23/2020    Clinic [facility] Prenatal Labs  Dating  LMP Blood type:     Genetic Screen  NIPS: Antibody:   Anatomic [place]  Rubella:   Varicella: @VZVIGG @  GTT Early: n/a      Third trimester:  RPR: NON-REACTIVE (07/19 1139)   Rhogam  HBsAg:     TDaP vaccine  Flu Shot: declines HIV: NON-REACTIVE (07/19 1139)   Baby Food  formula                              GBS:   Contraception  Pap:  CBB     CS/VBAC    Support Person          Dysmenorrhea 02/25/2019   Vitamin D deficiency disease    Acne     Past Surgical History:  History reviewed. No pertinent surgical history.  Gynecologic History: Patient's last menstrual period was 09/12/2020 (approximate).  Obstetric History: G1P0000  Family History:  Family History  Problem Relation Age of Onset   Diabetes Maternal Grandmother    Hypertension Maternal Grandmother    Breast cancer Neg Hx    Ovarian cancer Neg Hx    Colon  cancer Neg Hx     Social History:  Social History   Socioeconomic History   Marital status: Single    Spouse name: Not on file   Number of children: Not on file   Years of education: 12   Highest education level: High school graduate  Occupational History   Occupation: [employer]  Tobacco Use   Smoking status: Never Smoker   Smokeless tobacco: Never Used  Building services engineer Use: Former  Substance and Sexual Activity   Alcohol use: No   Drug use: Not Currently   Sexual activity: Yes    Birth control/protection: Patch  Other Topics Concern   Not on file  Social History Narrative   Not on file   Social Determinants of Health   Financial Resource Strain: Not on file  Food Insecurity: Not on file  Transportation Needs: Not on file  Physical Activity: Not on file  Stress: Not on file  Social Connections: Not on file  Intimate Partner Violence: Not on file    Allergies:  Allergies  Allergen Reactions   Bactrim [Sulfamethoxazole-Trimethoprim] Rash    Rash, dizzy, nausea    Medications: Prior to Admission medications   Medication Sig Start Date End Date Taking? Authorizing Provider  Doxylamine-Pyridoxine (DICLEGIS) 10-10 MG TBEC Take 1 tablet by mouth at bedtime as needed (for N/V). 10/06/20   Danelle Berry, PA-C    Physical Exam Vitals: Blood pressure 126/72, pulse 93, weight 157 lb (71.2 kg), last menstrual period 09/12/2020.  General: NAD HEENT: normocephalic, anicteric Thyroid: no enlargement, no palpable nodules Pulmonary: No increased work of breathing, CTAB Cardiovascular: RRR, distal pulses 2+ Abdomen: NABS, soft, non-tender, non-distended.  Umbilicus without lesions.  No hepatomegaly, splenomegaly or masses palpable. No evidence of hernia  Genitourinary:  External: Normal external female genitalia.  Normal urethral meatus, normal  Bartholin's and Skene's glands.    Vagina: Normal vaginal mucosa, no evidence of prolapse.    Cervix:  Grossly normal in appearance, no bleeding  Uterus: Enlarged (size consistent with 8-10 wk dates), mobile, normal contour.  No CMT  Adnexa: ovaries non-enlarged, no adnexal masses  Rectal: deferred Extremities: no edema, erythema, or tenderness Neurologic: Grossly intact Psychiatric: mood appropriate, affect full   Assessment: 19 y.o. G1P0000 at [redacted]w[redacted]d presenting to initiate prenatal care  Plan: 1) Avoid alcoholic beverages. 2) Patient encouraged not to smoke.  3) Discontinue the use of all non-medicinal drugs and chemicals.  4) Take prenatal vitamins daily.  5) Nutrition, food safety (fish, cheese advisories, and high nitrite foods) and exercise discussed. 6) Hospital and practice style discussed with cross coverage system.  7) Genetic Screening, such as with 1st Trimester Screening, cell free fetal DNA, AFP testing, and Ultrasound, as well as with amniocentesis and CVS as appropriate, is discussed with patient. At the conclusion of today's visit patient requested genetic testing - Inheritest today, NIPTs at next visit 8) NOB labs, GC/CT, urine cx collected today 9) RTC in two weeks for dating [place] and ROB   Zipporah Plants, CNM, MSN [facility], [facility] 11/23/2020, 9:18 AM

## 2020-11-24 LAB — RPR+RH+ABO+RUB AB+AB SCR+CB...
Antibody Screen: NEGATIVE
HIV Screen 4th Generation wRfx: NONREACTIVE
Hematocrit: 40.5 % (ref 34.0–46.6)
Hemoglobin: 14 g/dL (ref 11.1–15.9)
Hepatitis B Surface Ag: NEGATIVE
MCH: 33.2 pg — ABNORMAL HIGH (ref 26.6–33.0)
MCHC: 34.6 g/dL (ref 31.5–35.7)
MCV: 96 fL (ref 79–97)
Platelets: 204 10*3/uL (ref 150–450)
RBC: 4.22 x10E6/uL (ref 3.77–5.28)
RDW: 13.1 % (ref 11.7–15.4)
RPR Ser Ql: NONREACTIVE
Rh Factor: POSITIVE
Rubella Antibodies, IGG: 10.1 index (ref >0.99)
Varicella zoster IgG: 286 index (ref >165)
WBC: 7.2 10*3/uL (ref 3.4–10.8)

## 2020-11-24 LAB — CERVICOVAGINAL ANCILLARY ONLY
Chlamydia: NEGATIVE
Comment: NEGATIVE
Comment: NEGATIVE
Comment: NORMAL
Neisseria Gonorrhea: NEGATIVE
Trichomonas: NEGATIVE

## 2020-11-24 LAB — SICKLE CELL SCREEN: Sickle Cell Screen: NEGATIVE

## 2020-11-25 LAB — URINE DRUG PANEL 7
Amphetamines, Urine: NEGATIVE ng/mL
Barbiturate Quant, Ur: NEGATIVE ng/mL
Benzodiazepine Quant, Ur: NEGATIVE ng/mL
Cannabinoid Quant, Ur: NEGATIVE ng/mL
Cocaine (Metab.): NEGATIVE ng/mL
Opiate Quant, Ur: NEGATIVE ng/mL
PCP Quant, Ur: NEGATIVE ng/mL

## 2020-11-27 LAB — URINE CULTURE

## 2020-11-30 ENCOUNTER — Other Ambulatory Visit: Payer: Self-pay | Admitting: Obstetrics and Gynecology

## 2020-11-30 DIAGNOSIS — R8271 Bacteriuria: Secondary | ICD-10-CM

## 2020-11-30 DIAGNOSIS — O99891 Other specified diseases and conditions complicating pregnancy: Secondary | ICD-10-CM | POA: Insufficient documentation

## 2020-11-30 MED ORDER — CEPHALEXIN 500 MG PO CAPS: 500.0000 mg | ORAL_CAPSULE | Freq: Two times a day (BID) | ORAL | 0 refills | Status: DC

## 2020-12-04 LAB — INHERITEST CORE(CF97,SMA,FRAX)

## 2020-12-07 ENCOUNTER — Encounter: Payer: Self-pay | Admitting: Obstetrics and Gynecology

## 2020-12-07 ENCOUNTER — Ambulatory Visit (INDEPENDENT_AMBULATORY_CARE_PROVIDER_SITE_OTHER): Payer: Medicaid Other

## 2020-12-07 ENCOUNTER — Ambulatory Visit (INDEPENDENT_AMBULATORY_CARE_PROVIDER_SITE_OTHER): Payer: Medicaid Other | Admitting: Obstetrics and Gynecology

## 2020-12-07 ENCOUNTER — Other Ambulatory Visit: Payer: Self-pay

## 2020-12-07 VITALS — BP 112/70 | Ht 68.0 in | Wt 162.6 lb

## 2020-12-07 DIAGNOSIS — N926 Irregular menstruation, unspecified: Secondary | ICD-10-CM

## 2020-12-07 DIAGNOSIS — Z1379 Encounter for other screening for genetic and chromosomal anomalies: Secondary | ICD-10-CM

## 2020-12-07 DIAGNOSIS — Z3401 Encounter for supervision of normal first pregnancy, first trimester: Secondary | ICD-10-CM

## 2020-12-07 DIAGNOSIS — Z3A14 14 weeks gestation of pregnancy: Secondary | ICD-10-CM | POA: Diagnosis not present

## 2020-12-07 NOTE — Patient Instructions (Signed)

## 2020-12-07 NOTE — Progress Notes (Signed)
ROB [redacted]w[redacted]d

## 2020-12-07 NOTE — Progress Notes (Signed)
Routine Prenatal Care Visit  Subjective  Casey Garza is a 20 y.o. G1P0000 at [redacted]w[redacted]d being seen today for ongoing prenatal care.  She is currently monitored for the following issues for this low-risk pregnancy and has Vitamin D deficiency disease; Acne; Dysmenorrhea; Encounter for supervision of normal first pregnancy in first trimester; and Asymptomatic bacteriuria during pregnancy in first trimester on their problem list.  ----------------------------------------------------------------------------------- Patient reports no complaints.   Contractions: Not present. Vag. Bleeding: None.  Movement: Absent. Denies leaking of fluid.  ----------------------------------------------------------------------------------- The following portions of the patient's history were reviewed and updated as appropriate: allergies, current medications, past family history, past medical history, past social history, past surgical history and problem list. Problem list updated.   Objective  Blood pressure 112/70, height 5\' 8"  (1.727 m), weight 162 lb 9.6 oz (73.8 kg), last menstrual period 09/12/2020. Pregravid weight 150 lb (68 kg) Total Weight Gain 12 lb 9.6 oz (5.715 kg) Urinalysis:      Fetal Status: Fetal Heart Rate (bpm): 166   Movement: Absent     General:  Alert, oriented and cooperative. Patient is in no acute distress.  Skin: Skin is warm and dry. No rash noted.   Cardiovascular: Normal heart rate noted  Respiratory: Normal respiratory effort, no problems with respiration noted  Abdomen: Soft, gravid, appropriate for gestational age. Pain/Pressure: Absent     Pelvic:  Cervical exam deferred        Extremities: Normal range of motion.     Mental Status: Normal mood and affect. Normal behavior. Normal judgment and thought content.     Assessment   19 y.o. G1P0000 at [redacted]w[redacted]d by  06/03/2021, by Ultrasound presenting for routine prenatal visit  Plan   pregnancy 1 Problems (from 11/23/20 to  present)    Problem Noted Resolved   Asymptomatic bacteriuria during pregnancy in first trimester 11/30/2020 by 01/28/2021, CNM No   Encounter for supervision of normal first pregnancy in first trimester 11/23/2020 by 11/25/2020, CNM No   Overview Addendum 12/07/2020 11:58 AM by 02/04/2021, MD     Nursing Staff Provider  Office Location  [facility] Dating   14 wk Natale Milch  Language  English Anatomy US    Flu Vaccine   Genetic Screen  NIPS:    TDaP vaccine    Hgb A1C or  GTT Third trimester :   Rhogam   not needed   LAB RESULTS   Feeding Plan  formula Blood Type B/Positive/-- (12/27 0905)   Contraception  Antibody Negative (12/27 0905)  Circumcision  Rubella 10.10 (12/27 0905)  Pediatrician   RPR Non Reactive (12/27 0905)   Support Person  HBsAg Negative (12/27 0905)   Prenatal Classes  HIV Non Reactive (12/27 0905)    Varicella immune  BTL Consent  GBS  (For PCN allergy, check sensitivities)        VBAC Consent  Pap  under 21 at NOB [ ]  postpartum pap    Hgb Electro   negative  Covid Considering vaccination CF negative     SMA  negative              Previous Version      Encouraged covid vaccination- patient desires vaccination, given information NIPT testing today Flu shot today  Gestational age appropriate obstetric precautions including but not limited to vaginal bleeding, contractions, leaking of fluid and fetal movement were reviewed in detail with the patient.    Return in about 5 weeks (around  01/11/2021) for ROB in person and anatomy US.  Natale Milch MD [facility], [facility] 12/07/2020, 11:59 AM

## 2020-12-12 LAB — MATERNIT21 PLUS CORE+SCA
Fetal Fraction: 13
Monosomy X (Turner Syndrome): NOT DETECTED
Result (T21): NEGATIVE
Trisomy 13 (Patau syndrome): NEGATIVE
Trisomy 18 (Edwards syndrome): NEGATIVE
Trisomy 21 (Down syndrome): NEGATIVE
XXX (Triple X Syndrome): NOT DETECTED
XXY (Klinefelter Syndrome): NOT DETECTED
XYY (Jacobs Syndrome): NOT DETECTED

## 2021-01-11 ENCOUNTER — Encounter: Payer: Self-pay | Admitting: Obstetrics and Gynecology

## 2021-01-11 ENCOUNTER — Ambulatory Visit (INDEPENDENT_AMBULATORY_CARE_PROVIDER_SITE_OTHER): Payer: Medicaid Other | Admitting: Obstetrics and Gynecology

## 2021-01-11 ENCOUNTER — Other Ambulatory Visit: Payer: Self-pay

## 2021-01-11 ENCOUNTER — Ambulatory Visit (INDEPENDENT_AMBULATORY_CARE_PROVIDER_SITE_OTHER): Payer: Medicaid Other

## 2021-01-11 VITALS — BP 114/70 | Ht 68.0 in | Wt 170.6 lb

## 2021-01-11 DIAGNOSIS — Z3A19 19 weeks gestation of pregnancy: Secondary | ICD-10-CM

## 2021-01-11 DIAGNOSIS — Z3A18 18 weeks gestation of pregnancy: Secondary | ICD-10-CM | POA: Diagnosis not present

## 2021-01-11 DIAGNOSIS — Z3402 Encounter for supervision of normal first pregnancy, second trimester: Secondary | ICD-10-CM | POA: Diagnosis not present

## 2021-01-11 DIAGNOSIS — Z3401 Encounter for supervision of normal first pregnancy, first trimester: Secondary | ICD-10-CM

## 2021-01-11 LAB — POCT URINALYSIS DIPSTICK OB
Glucose, UA: NEGATIVE
POC,PROTEIN,UA: NEGATIVE

## 2021-01-11 NOTE — Progress Notes (Signed)
Routine Prenatal Care Visit  Subjective  Casey Garza is a 20 y.o. G1P0000 at [redacted]w[redacted]d being seen today for ongoing prenatal care.  She is currently monitored for the following issues for this low-risk pregnancy and has Vitamin D deficiency disease; Acne; Dysmenorrhea; Encounter for supervision of normal first pregnancy in first trimester; and Asymptomatic bacteriuria during pregnancy in first trimester on their problem list.  ----------------------------------------------------------------------------------- Patient reports no complaints.   Contractions: Not present. Vag. Bleeding: None.  Movement: Present. Denies leaking of fluid.  ----------------------------------------------------------------------------------- The following portions of the patient's history were reviewed and updated as appropriate: allergies, current medications, past family history, past medical history, past social history, past surgical history and problem list. Problem list updated.   Objective  Blood pressure 114/70, height 5\' 8"  (1.727 m), weight 170 lb 9.6 oz (77.4 kg), last menstrual period 09/12/2020. Pregravid weight 150 lb (68 kg) Total Weight Gain 20 lb 9.6 oz (9.344 kg) Urinalysis:      Fetal Status: Fetal Heart Rate (bpm): 145   Movement: Present     General:  Alert, oriented and cooperative. Patient is in no acute distress.  Skin: Skin is warm and dry. No rash noted.   Cardiovascular: Normal heart rate noted  Respiratory: Normal respiratory effort, no problems with respiration noted  Abdomen: Soft, gravid, appropriate for gestational age. Pain/Pressure: Absent     Pelvic:  Cervical exam deferred        Extremities: Normal range of motion.  Edema: None  Mental Status: Normal mood and affect. Normal behavior. Normal judgment and thought content.     Assessment   19 y.o. G1P0000 at [redacted]w[redacted]d by  06/03/2021, by Ultrasound presenting for routine prenatal visit  Plan   pregnancy 1 Problems (from  11/23/20 to present)    Problem Noted Resolved   Asymptomatic bacteriuria during pregnancy in first trimester 11/30/2020 by 01/28/2021, CNM No   Encounter for supervision of normal first pregnancy in first trimester 11/23/2020 by 11/25/2020, CNM No   Overview Addendum 01/11/2021 12:06 PM by 01/13/2021, MD     Nursing Staff Provider  Office Location  [facility] Dating   14 wk Natale Milch  Language  English Anatomy US   complete  Flu Vaccine  12/08/2019 Genetic Screen  NIPS: normal xx    TDaP vaccine    Hgb A1C or  GTT Third trimester :   Rhogam   not needed   LAB RESULTS   Feeding Plan  formula Blood Type B/Positive/-- (12/27 0905)   Contraception  Antibody Negative (12/27 0905)  Circumcision  Rubella 10.10 (12/27 0905)  Pediatrician   RPR Non Reactive (12/27 0905)   Support Person  HBsAg Negative (12/27 0905)   Prenatal Classes  discussed HIV Non Reactive (12/27 0905)    Varicella immune  BTL Consent  GBS  (For PCN allergy, check sensitivities)        VBAC Consent  Pap  under 21 at NOB [ ]  postpartum pap    Hgb Electro   negative  Covid Considering vaccination CF negative     SMA  negative              Previous Version       Gestational age appropriate obstetric precautions including but not limited to vaginal bleeding, contractions, leaking of fluid and fetal movement were reviewed in detail with the patient.    Return in about 4 weeks (around 02/08/2021) for ROB in person.  MD  [facility], [facility] 01/11/2021, 12:06 PM

## 2021-01-11 NOTE — Patient Instructions (Signed)

## 2021-02-08 ENCOUNTER — Other Ambulatory Visit: Payer: Self-pay

## 2021-02-08 ENCOUNTER — Ambulatory Visit (INDEPENDENT_AMBULATORY_CARE_PROVIDER_SITE_OTHER): Payer: Medicaid Other | Admitting: Obstetrics and Gynecology

## 2021-02-08 VITALS — BP 120/70 | Ht 68.0 in

## 2021-02-08 DIAGNOSIS — Z3402 Encounter for supervision of normal first pregnancy, second trimester: Secondary | ICD-10-CM

## 2021-02-08 DIAGNOSIS — Z3A23 23 weeks gestation of pregnancy: Secondary | ICD-10-CM

## 2021-02-08 LAB — POCT URINALYSIS DIPSTICK OB
Glucose, UA: NEGATIVE
POC,PROTEIN,UA: NEGATIVE

## 2021-02-08 NOTE — Patient Instructions (Signed)

## 2021-02-08 NOTE — Progress Notes (Signed)
Routine Prenatal Care Visit  Subjective  Casey Garza is a 20 y.o. G1P0000 at [redacted]w[redacted]d being seen today for ongoing prenatal care.  She is currently monitored for the following issues for this low-risk pregnancy and has Vitamin D deficiency disease; Acne; Dysmenorrhea; Encounter for supervision of normal first pregnancy in first trimester; and Asymptomatic bacteriuria during pregnancy in first trimester on their problem list.  ----------------------------------------------------------------------------------- Patient reports no complaints.   Contractions: Not present. Vag. Bleeding: None.  Movement: Present. Denies leaking of fluid.  ----------------------------------------------------------------------------------- The following portions of the patient's history were reviewed and updated as appropriate: allergies, current medications, past family history, past medical history, past social history, past surgical history and problem list. Problem list updated.   Objective  Blood pressure 120/70, height 5\' 8"  (1.727 m), last menstrual period 09/12/2020. Pregravid weight 150 lb (68 kg) Total Weight Gain 20 lb 9.6 oz (9.344 kg) Urinalysis:      Fetal Status: Fetal Heart Rate (bpm): 140   Movement: Present     General:  Alert, oriented and cooperative. Patient is in no acute distress.  Skin: Skin is warm and dry. No rash noted.   Cardiovascular: Normal heart rate noted  Respiratory: Normal respiratory effort, no problems with respiration noted  Abdomen: Soft, gravid, appropriate for gestational age. Pain/Pressure: Absent     Pelvic:  Cervical exam deferred        Extremities: Normal range of motion.  Edema: None  Mental Status: Normal mood and affect. Normal behavior. Normal judgment and thought content.     Assessment   19 y.o. G1P0000 at [redacted]w[redacted]d by  06/03/2021, by Ultrasound presenting for routine prenatal visit  Plan   pregnancy 1 Problems (from 11/23/20 to present)    Problem Noted  Resolved   Asymptomatic bacteriuria during pregnancy in first trimester 11/30/2020 by 01/28/2021, CNM No   Encounter for supervision of normal first pregnancy in first trimester 11/23/2020 by 11/25/2020, CNM No   Overview Addendum 01/11/2021 12:06 PM by 01/13/2021, MD     Nursing Staff Provider  Office Location  [facility] Dating   14 wk Natale Milch  Language  English Anatomy US   complete  Flu Vaccine  12/08/2019 Genetic Screen  NIPS: normal xx    TDaP vaccine    Hgb A1C or  GTT Third trimester :   Rhogam   not needed   LAB RESULTS   Feeding Plan  formula Blood Type B/Positive/-- (12/27 0905)   Contraception  Antibody Negative (12/27 0905)  Circumcision  Rubella 10.10 (12/27 0905)  Pediatrician   RPR Non Reactive (12/27 0905)   Support Person  HBsAg Negative (12/27 0905)   Prenatal Classes  discussed HIV Non Reactive (12/27 0905)    Varicella immune  BTL Consent  GBS  (For PCN allergy, check sensitivities)        VBAC Consent  Pap  under 21 at NOB [ ]  postpartum pap    Hgb Electro   negative  Covid Considering vaccination CF negative     SMA  negative              Previous Version       Discussed available prenatal classes Provided information and contraception options postpartum  Gestational age appropriate obstetric precautions including but not limited to vaginal bleeding, contractions, leaking of fluid and fetal movement were reviewed in detail with the patient.    Return in about 4 weeks (around 03/08/2021) for ROB and 1 GTT.  Natale Milch MD [facility],  [facility] 02/08/2021, 11:08 AM

## 2021-03-08 ENCOUNTER — Ambulatory Visit (INDEPENDENT_AMBULATORY_CARE_PROVIDER_SITE_OTHER): Payer: Medicaid Other | Admitting: Obstetrics and Gynecology

## 2021-03-08 ENCOUNTER — Other Ambulatory Visit: Payer: Self-pay

## 2021-03-08 ENCOUNTER — Encounter: Payer: Self-pay | Admitting: Obstetrics and Gynecology

## 2021-03-08 ENCOUNTER — Observation Stay
Admission: EM | Admit: 2021-03-08 | Discharge: 2021-03-08 | Disposition: A | Discharge status: Home / Self Care | Payer: Medicaid Other | Attending: Obstetrics and Gynecology | Admitting: Obstetrics and Gynecology

## 2021-03-08 ENCOUNTER — Other Ambulatory Visit: Payer: Medicaid Other

## 2021-03-08 VITALS — BP 118/72 | Ht 68.0 in | Wt 192.8 lb

## 2021-03-08 DIAGNOSIS — R102 Pelvic and perineal pain: Secondary | ICD-10-CM

## 2021-03-08 DIAGNOSIS — O23592 Infection of other part of genital tract in pregnancy, second trimester: Principal | ICD-10-CM | POA: Insufficient documentation

## 2021-03-08 DIAGNOSIS — B373 Candidiasis of vulva and vagina: Secondary | ICD-10-CM | POA: Insufficient documentation

## 2021-03-08 DIAGNOSIS — O99891 Other specified diseases and conditions complicating pregnancy: Secondary | ICD-10-CM

## 2021-03-08 DIAGNOSIS — Z3401 Encounter for supervision of normal first pregnancy, first trimester: Secondary | ICD-10-CM

## 2021-03-08 DIAGNOSIS — Z3A27 27 weeks gestation of pregnancy: Secondary | ICD-10-CM

## 2021-03-08 DIAGNOSIS — R875 Abnormal microbiological findings in specimens from female genital organs: Secondary | ICD-10-CM

## 2021-03-08 DIAGNOSIS — O4700 False labor before 37 completed weeks of gestation, unspecified trimester: Secondary | ICD-10-CM

## 2021-03-08 DIAGNOSIS — R8271 Bacteriuria: Secondary | ICD-10-CM

## 2021-03-08 DIAGNOSIS — Z3402 Encounter for supervision of normal first pregnancy, second trimester: Secondary | ICD-10-CM

## 2021-03-08 DIAGNOSIS — N760 Acute vaginitis: Secondary | ICD-10-CM

## 2021-03-08 LAB — WET PREP, GENITAL
Clue Cells Wet Prep HPF POC: NONE SEEN
Sperm: NONE SEEN
Trich, Wet Prep: NONE SEEN
Yeast Wet Prep HPF POC: NONE SEEN

## 2021-03-08 LAB — FETAL FIBRONECTIN: Fetal Fibronectin: NEGATIVE

## 2021-03-08 MED ORDER — TERCONAZOLE 0.4 % VA CREA: 1.0000 | TOPICAL_CREAM | Freq: Every day | VAGINAL | 0 refills | Status: DC

## 2021-03-08 NOTE — OB Triage Note (Signed)
Patient G1P0 [redacted]w[redacted]d presents to L&D from office visit for complaints of vaginal pain. Patient states the pain feels like a "shooting pain" and like "lightening". She denies contractions, leaking of fluid, vaginal bleeding and reports good fetal movement. VSS. Monitors applied and assessing.

## 2021-03-08 NOTE — Progress Notes (Signed)
CNM and RN at bedside to review lab results and discuss discharge instructions. Patient verbalized understanding and all questions answered.

## 2021-03-08 NOTE — Progress Notes (Addendum)
[place], CNM notified of difficulty tracing FHR on monitor. RN at bedside adjusting FHR monitor regularly. Order given to discontinue monitoring.

## 2021-03-08 NOTE — Progress Notes (Signed)
Routine Prenatal Care Visit  Subjective  Casey Garza is a 20 y.o. G1P0000 at [redacted]w[redacted]d being seen today for ongoing prenatal care.  She is currently monitored for the following issues for this low-risk pregnancy and has Vitamin D deficiency disease; Acne; Dysmenorrhea; Encounter for supervision of normal first pregnancy in first trimester; and Asymptomatic bacteriuria during pregnancy in first trimester on their problem list.  ----------------------------------------------------------------------------------- Patient reports feeling vaginal pressure while standing especially at work.   Contractions: Irregular. Vag. Bleeding: None.  Movement: Present. Denies leaking of fluid.  ----------------------------------------------------------------------------------- The following portions of the patient's history were reviewed and updated as appropriate: allergies, current medications, past family history, past medical history, past social history, past surgical history and problem list. Problem list updated.   Objective  Blood pressure 118/72, height 5\' 8"  (1.727 m), weight 192 lb 12.8 oz (87.5 kg), last menstrual period 09/12/2020. Pregravid weight 150 lb (68 kg) Total Weight Gain 42 lb (19.1 kg) Urinalysis:      Fetal Status: Fetal Heart Rate (bpm): 150 Fundal Height: 26 cm Movement: Present     General:  Alert, oriented and cooperative. Patient is in no acute distress.  Skin: Skin is warm and dry. No rash noted.   Cardiovascular: Normal heart rate noted  Respiratory: Normal respiratory effort, no problems with respiration noted  Abdomen: Soft, gravid, appropriate for gestational age. Pain/Pressure: Present     Pelvic:  Cervical exam performed Dilation: 1 Effacement (%): 50 Station: -3  Extremities: Normal range of motion.  Edema: None  Mental Status: Normal mood and affect. Normal behavior. Normal judgment and thought content.     Assessment   19 y.o. G1P0000 at [redacted]w[redacted]d by  06/03/2021, by  Ultrasound presenting for routine prenatal visit  Plan   pregnancy 1 Problems (from 11/23/20 to present)    Problem Noted Resolved   Asymptomatic bacteriuria during pregnancy in first trimester 11/30/2020 by 01/28/2021, CNM No   Encounter for supervision of normal first pregnancy in first trimester 11/23/2020 by 11/25/2020, CNM No   Overview Addendum 02/08/2021 11:10 AM by 02/10/2021, MD     Nursing Staff Provider  Office Location  [facility] Dating   14 wk Natale Milch  Language  English Anatomy US   complete  Flu Vaccine  12/08/2019 Genetic Screen  NIPS: normal xx    TDaP vaccine    Hgb A1C or  GTT Third trimester :   Rhogam   not needed   LAB RESULTS   Feeding Plan  formula Blood Type B/Positive/-- (12/27 0905)   Contraception  declines Antibody Negative (12/27 0905)  Circumcision  Rubella 10.10 (12/27 0905)  Pediatrician   RPR Non Reactive (12/27 0905)   Support Person  [place] HBsAg Negative (12/27 0905)   Prenatal Classes  discussed HIV Non Reactive (12/27 0905)    Varicella immune  BTL Consent  GBS  (For PCN allergy, check sensitivities)        VBAC Consent  Pap  under 21 at NOB [ ]  postpartum pap    Hgb Electro   negative  Covid Considering vaccination CF negative     SMA  negative              Previous Version       1 GTT today. Collected an FFN and sent to L&D for contraction evaluation with specimen. Treat for yeast infection. rx for teazol sent  Gestational age appropriate obstetric precautions including but not limited to vaginal bleeding, contractions,  leaking of fluid and fetal movement were reviewed in detail with the patient.    Return in about 2 weeks (around 03/22/2021) for ROB in person.  Natale Milch MD [facility], [facility] 03/08/2021, 12:18 PM

## 2021-03-08 NOTE — Patient Instructions (Signed)
Preterm Labor The normal length of a pregnancy is 39-41 weeks. Preterm labor is when labor starts before 37 completed weeks of pregnancy. Babies who are born prematurely and survive may not be fully developed and may be at an increased risk for long-term problems such as cerebral palsy, developmental delays, and vision and hearing problems. Babies who are born too early may have problems soon after birth. Problems may include regulating blood sugar, body temperature, heart rate, and breathing rate. These babies often have trouble with feeding. The risk of having problems is highest for babies who are born before 34 weeks of pregnancy. What are the causes? The exact cause of this condition is not known. What increases the risk? You are more likely to have preterm labor if you have certain risk factors that relate to your medical history, problems with present and past pregnancies, and lifestyle factors. Medical history  You have abnormalities of the uterus, including a short cervix.  You have STIs (sexually transmitted infections), or other infections of the urinary tract and the vagina.  You have chronic illnesses, such as blood clotting problems, diabetes, or high blood pressure.  You are overweight or underweight. Present and past pregnancies  You have had preterm labor before.  You are pregnant with twins or other multiples.  You have been diagnosed with a condition in which the placenta covers your cervix (placenta previa).  You waited less than 6 months between giving birth and becoming pregnant again.  Your unborn baby has some abnormalities.  You have vaginal bleeding during pregnancy.  You became pregnant through in vitro fertilization (IVF). Lifestyle and environmental factors  You use tobacco products.  You drink alcohol.  You use street drugs.  You have stress and no social support.  You experience domestic violence.  You are exposed to certain chemicals or  environmental pollutants. Other factors  You are younger than age 17 or older than age 35. What are the signs or symptoms? Symptoms of this condition include:  Cramps similar to those that can happen during a menstrual period. The cramps may happen with diarrhea.  Pain in the abdomen or lower back.  Regular contractions that may feel like tightening of the abdomen.  A feeling of increased pressure in the pelvis.  Increased watery or bloody mucus discharge from the vagina.  Water breaking (ruptured amniotic sac). How is this diagnosed? This condition is diagnosed based on:  Your medical history and a physical exam.  A pelvic exam.  An ultrasound.  Monitoring your uterus for contractions.  Other tests, including: ? A swab of the cervix to check for a chemical called fetal fibronectin. ? Urine tests. How is this treated? Treatment for this condition depends on the length of your pregnancy, your condition, and the health of your baby. Treatment may include:  Taking medicines, such as: ? Hormone medicines. These may be given early in pregnancy to help support the pregnancy. ? Medicines to stop contractions. ? Medicines to help mature the baby's lungs. These may be prescribed if the risk of delivery is high. ? Medicines to prevent your baby from developing cerebral palsy.  Bed rest. If the labor happens before 34 weeks of pregnancy, you may need to stay in the hospital.  Delivery of the baby. Follow these instructions at home:  Do not use any products that contain nicotine or tobacco, such as cigarettes, e-cigarettes, and chewing tobacco. If you need help quitting, ask your health care provider.  Do not drink alcohol.    Take over-the-counter and prescription medicines only as told by your health care provider.  Rest as told by your health care provider.  Return to your normal activities as told by your health care provider. Ask your health care provider what activities  are safe for you.  Keep all follow-up visits as told by your health care provider. This is important.   How is this prevented? To increase your chance of having a full-term pregnancy:  Do not use street drugs or medicines that have not been prescribed to you during your pregnancy.  Talk with your health care provider before taking any herbal supplements, even if you have been taking them regularly.  Make sure you gain a healthy amount of weight during your pregnancy.  Watch for infection. If you think that you might have an infection, get it checked right away. Symptoms of infection may include: ? Fever. ? Abnormal vaginal discharge or discharge that smells bad. ? Pain or burning with urination. ? Needing to urinate urgently. ? Frequently urinating or passing small amounts of urine frequently. ? Blood in your urine. ? Urine that smells bad or unusual.  Tell your health care provider if you have had preterm labor before. Contact a health care provider if:  You think you are going into preterm labor.  You have signs or symptoms of preterm labor.  You have symptoms of infection. Get help right away if:  You are having regular, painful contractions every 5 minutes or less.  Your water breaks. Summary  Preterm labor is labor that starts before you reach 37 weeks of pregnancy.  Delivering your baby early increases your baby's risk of developing lifelong problems.  The exact cause of preterm labor is unknown. However, having an abnormal uterus, an STI (sexually transmitted infection), or vaginal bleeding during pregnancy increases your risk for preterm labor.  Keep all follow-up visits as told by your health care provider. This is important.  Contact a health care provider if you have signs or symptoms of preterm labor. This information is not intended to replace advice given to you by your health care provider. Make sure you discuss any questions you have with your health care  provider. Document Revised: 12/17/2019 Document Reviewed: 12/17/2019 Elsevier Patient Education  2021 Elsevier Inc.  

## 2021-03-08 NOTE — Discharge Summary (Signed)
Physician Final Progress Note  Patient ID: Casey Garza MRN: [id number] DOB/AGE: July 07, 2001 20 y.o.  Admit date: 03/08/2021 Admitting provider: Tresea Garza, CNM Discharge date: 03/08/2021   Admission Diagnoses: vaginal pain  Discharge Diagnoses:  Active Problems:   Labor and delivery, indication for care   [redacted] weeks gestation of pregnancy  Empirical treatment for vaginal yeast infection  History of Present Illness: The patient is a 20 y.o. female G1P0000 at [redacted]w[redacted]d who presents from the office for vaginal pain. She was 1 cm dilated when checked in the office. FFN and vaginitis swab collected in the office. Patient was sent for monitoring and labs. She denies vaginal bleeding or leakage of fluid. She denies abdominal or back pain. She admits good fetal movement.   Monitoring is reassuring- no evidence of contractions. FFN is negative and wet prep done in triage is negative except for WBCs. Patient has prescription for terconazole. Will follow up as needed after labs result. Patient is discharged with instructions and precautions.   Past Medical History:  Diagnosis Date  . Acne   . Vitamin D deficiency disease     History reviewed. No pertinent surgical history.  No current facility-administered medications on file prior to encounter.   Current Outpatient Medications on File Prior to Encounter  Medication Sig Dispense Refill  . Prenatal Vit-Fe Fumarate-FA (MULTIVITAMIN-PRENATAL) 27-0.8 MG TABS tablet Take 1 tablet by mouth daily at 12 noon.    Marland Kitchen terconazole (TERAZOL 7) 0.4 % vaginal cream Place 1 applicator vaginally at bedtime. 45 g 0    Allergies  Allergen Reactions  . Bactrim [Sulfamethoxazole-Trimethoprim] Rash    Rash, dizzy, nausea    Social History   Socioeconomic History  . Marital status: Single    Spouse name: Not on file  . Number of children: Not on file  . Years of education: 53  . Highest education level: High school graduate  Occupational History  .  Occupation: [employer]  Tobacco Use  . Smoking status: Never Smoker  . Smokeless tobacco: Never Used  Vaping Use  . Vaping Use: Former  Substance and Sexual Activity  . Alcohol use: No  . Drug use: Not Currently  . Sexual activity: Yes    Birth control/protection: Patch  Other Topics Concern  . Not on file  Social History Narrative  . Not on file   Social Determinants of Health   Financial Resource Strain: Not on file  Food Insecurity: Not on file  Transportation Needs: Not on file  Physical Activity: Not on file  Stress: Not on file  Social Connections: Not on file  Intimate Partner Violence: Not on file    Family History  Problem Relation Age of Onset  . Diabetes Maternal Grandmother   . Hypertension Maternal Grandmother   . Breast cancer Neg Hx   . Ovarian cancer Neg Hx   . Colon cancer Neg Hx      Review of Systems  Constitutional: Negative for chills and fever.  HENT: Negative for congestion, ear discharge, ear pain, hearing loss, sinus pain and sore throat.   Eyes: Negative for blurred vision and double vision.  Respiratory: Negative for cough, shortness of breath and wheezing.   Cardiovascular: Negative for chest pain, palpitations and leg swelling.  Gastrointestinal: Negative for abdominal pain, blood in stool, constipation, diarrhea, heartburn, melena, nausea and vomiting.  Genitourinary: Negative for dysuria, flank pain, frequency, hematuria and urgency.       Positive for vaginal pain  Musculoskeletal: Negative for back  pain, joint pain and myalgias.  Skin: Negative for itching and rash.  Neurological: Negative for dizziness, tingling, tremors, sensory change, speech change, focal weakness, seizures, loss of consciousness, weakness and headaches.  Endo/Heme/Allergies: Negative for environmental allergies. Does not bruise/bleed easily.  Psychiatric/Behavioral: Negative for depression, hallucinations, memory loss, substance abuse and suicidal ideas. The  patient is not nervous/anxious and does not have insomnia.      Physical Exam: BP 128/83 (BP Location: Right Arm)   Pulse 92   Temp 98.2 F (36.8 C) (Oral)   Resp 16   Ht 5\' 8"  (1.727 m)   Wt 87.1 kg   LMP 09/12/2020 (Approximate)   BMI 29.19 kg/m   Constitutional: Well nourished, well developed female in no acute distress.  HEENT: normal Skin: Warm and dry.  Cardiovascular: Regular rate and rhythm.   Extremity: no edema Respiratory: Clear to auscultation bilateral. Normal respiratory effort Abdomen: FHT present Back: no CVAT Neuro: DTRs 2+, Cranial nerves grossly intact Psych: Alert and Oriented x3. No memory deficits. Normal mood and affect.  MS: normal gait, normal bilateral lower extremity ROM/strength/stability.  Pelvic exam: deferred Toco: negative for contractions Fetal well being: 145 bpm, moderate variability, 10x10 accelerations present, -decelerations  Consults: None  Significant Findings/ Diagnostic Studies: labs:  Results for [name], [name] (MRN Erroll Luna) as of 03/08/2021 14:30  Ref. Range 03/08/2021 12:49  Fetal Fibronectin Latest Ref Range: NEGATIVE  NEGATIVE  Yeast Wet Prep HPF POC Latest Ref Range: NONE SEEN  NONE SEEN  Trich, Wet Prep Latest Ref Range: NONE SEEN  NONE SEEN  Clue Cells Wet Prep HPF POC Latest Ref Range: NONE SEEN  NONE SEEN  WBC, Wet Prep HPF POC Latest Ref Range: NONE SEEN  RARE (A)  WET PREP, GENITAL Unknown Rpt (A)   Procedures: NST  Hospital Course: The patient was admitted to Labor and Delivery Triage for observation.   Discharge Condition: good  Disposition: Discharge disposition: 01-Home or Self Care  Diet: Regular diet  Discharge Activity: Activity as tolerated  Discharge Instructions    Discharge activity:  No Restrictions   Complete by: As directed    Discharge diet:  No restrictions   Complete by: As directed    Notify physician for a general feeling that "something is not right"   Complete by: As directed     Notify physician for increase or change in vaginal discharge   Complete by: As directed    Notify physician for intestinal cramps, with or without diarrhea, sometimes described as "gas pain"   Complete by: As directed    Notify physician for leaking of fluid   Complete by: As directed    Notify physician for low, dull backache, unrelieved by heat or Tylenol   Complete by: As directed    Notify physician for menstrual like cramps   Complete by: As directed    Notify physician for pelvic pressure   Complete by: As directed    Notify physician for uterine contractions.  These may be painless and feel like the uterus is tightening or the baby is  "balling up"   Complete by: As directed    Notify physician for vaginal bleeding   Complete by: As directed    PRETERM LABOR:  Includes any of the follwing symptoms that occur between 20 - [redacted] weeks gestation.  If these symptoms are not stopped, preterm labor can result in preterm delivery, placing your baby at risk   Complete by: As directed  Allergies as of 03/08/2021      Reactions   Bactrim [sulfamethoxazole-trimethoprim] Rash   Rash, dizzy, nausea      Medication List    TAKE these medications   multivitamin-prenatal 27-0.8 MG Tabs tablet Take 1 tablet by mouth daily at 12 noon.   terconazole 0.4 % vaginal cream Commonly known as: TERAZOL 7 Place 1 applicator vaginally at bedtime.       Follow-up Information    [facility]. Go to.   Specialty: Obstetrics and Gynecology Why: scheduled prenatal appointment Contact information: [address] [place] [address] [phone]              Total time spent taking care of this patient: 23 minutes  Signed: Tresea Garza, CNM  03/08/2021, 2:14 PM

## 2021-03-09 ENCOUNTER — Telehealth: Payer: Self-pay | Admitting: Dietician

## 2021-03-09 LAB — 28 WEEK RH+PANEL
Basophils Absolute: 0 10*3/uL (ref 0.0–0.2)
Basos: 1 %
EOS (ABSOLUTE): 0.1 10*3/uL (ref 0.0–0.4)
Eos: 1 %
Gestational Diabetes Screen: 101 mg/dL (ref 65–139)
HIV Screen 4th Generation wRfx: NONREACTIVE
Hematocrit: 38.8 % (ref 34.0–46.6)
Hemoglobin: 13.6 g/dL (ref 11.1–15.9)
Immature Grans (Abs): 0 10*3/uL (ref 0.0–0.1)
Immature Granulocytes: 0 %
Lymphocytes Absolute: 2.1 10*3/uL (ref 0.7–3.1)
Lymphs: 24 %
MCH: 35.4 pg — ABNORMAL HIGH (ref 26.6–33.0)
MCHC: 35.1 g/dL (ref 31.5–35.7)
MCV: 101 fL — ABNORMAL HIGH (ref 79–97)
Monocytes Absolute: 0.6 10*3/uL (ref 0.1–0.9)
Monocytes: 7 %
Neutrophils Absolute: 5.8 10*3/uL (ref 1.4–7.0)
Neutrophils: 67 %
Platelets: 218 10*3/uL (ref 150–450)
RBC: 3.84 x10E6/uL (ref 3.77–5.28)
RDW: 12.4 % (ref 11.7–15.4)
RPR Ser Ql: NONREACTIVE
WBC: 8.7 10*3/uL (ref 3.4–10.8)

## 2021-03-09 NOTE — Telephone Encounter (Signed)
Transition Care Management Follow-up Telephone Call  Date of discharge and from where: 03/08/21 [facility]  How have you been since you were released from the hospital? Ok taking it slow  Any questions or concerns? No wants a work note to give to boss  Items Reviewed:  Did the pt receive and understand the discharge instructions provided? Yes   Medications obtained and verified? Yes   Other? No   Any new allergies since your discharge? No   Dietary orders reviewed? No  Do you have support at home? Yes   Home Care and Equipment/Supplies: Were home health services ordered? no  If so, what is the name of the agency?Not applicable Has the agency set up a time to come to the patient's home? not applicable Were any new equipment or medical supplies ordered?  No What is the name of the medical supply agency? Not applicable Were you able to get the supplies/equipment? not applicable Do you have any questions related to the use of the equipment or supplies? No  Functional Questionnaire: (I = Independent and D = Dependent) ADLs: I  Bathing/Dressing- I  Meal Prep- I  Eating- I  Maintaining continence- I  Transferring/Ambulation-I  Managing Meds- I  Follow up appointments reviewed:   PCP Hospital f/u appt confirmed? Yes  Scheduled to see appt not made yet  Specialist Hospital f/u appt confirmed? Yes  Scheduled to see not called yet by pt .  Are transportation arrangements needed? No   If their condition worsens, is the pt aware to call PCP or go to the Emergency Dept.? Yes  Was the patient provided with contact information for the PCP's office or ED? Yes  Was to pt encouraged to call back with questions or concerns? Yes

## 2021-03-11 LAB — NUSWAB VAGINITIS PLUS (VG+)
Candida albicans, NAA: POSITIVE — AB
Candida glabrata, NAA: NEGATIVE
Chlamydia trachomatis, NAA: NEGATIVE
Neisseria gonorrhoeae, NAA: NEGATIVE
Trich vag by NAA: NEGATIVE

## 2021-03-16 ENCOUNTER — Encounter: Payer: Self-pay | Admitting: Obstetrics and Gynecology

## 2021-03-22 ENCOUNTER — Other Ambulatory Visit: Payer: Self-pay

## 2021-03-22 ENCOUNTER — Ambulatory Visit (INDEPENDENT_AMBULATORY_CARE_PROVIDER_SITE_OTHER): Payer: Medicaid Other | Admitting: Obstetrics and Gynecology

## 2021-03-22 ENCOUNTER — Encounter: Payer: Self-pay | Admitting: Obstetrics and Gynecology

## 2021-03-22 VITALS — BP 128/74 | Ht 68.0 in | Wt 199.6 lb

## 2021-03-22 DIAGNOSIS — Z23 Encounter for immunization: Secondary | ICD-10-CM

## 2021-03-22 DIAGNOSIS — Z3401 Encounter for supervision of normal first pregnancy, first trimester: Secondary | ICD-10-CM

## 2021-03-22 NOTE — Progress Notes (Signed)
Routine Prenatal Care Visit  Subjective  Casey Garza is a 20 y.o. G1P0000 at [redacted]w[redacted]d being seen today for ongoing prenatal care.  She is currently monitored for the following issues for this low-risk pregnancy and has Vitamin D deficiency disease; Acne; Dysmenorrhea; Encounter for supervision of normal first pregnancy in first trimester; Asymptomatic bacteriuria during pregnancy in first trimester; Labor and delivery, indication for care; and [redacted] weeks gestation of pregnancy on their problem list.  ----------------------------------------------------------------------------------- Patient reports no complaints.   Contractions: Irregular. Vag. Bleeding: None.  Movement: Present. Denies leaking of fluid.  ----------------------------------------------------------------------------------- The following portions of the patient's history were reviewed and updated as appropriate: allergies, current medications, past family history, past medical history, past social history, past surgical history and problem list. Problem list updated.   Objective  Blood pressure 128/74, height 5\' 8"  (1.727 m), weight 199 lb 9.6 oz (90.5 kg), last menstrual period 09/12/2020. Pregravid weight 150 lb (68 kg) Total Weight Gain 49 lb 9.6 oz (22.5 kg) Urinalysis:      Fetal Status: Fetal Heart Rate (bpm): 140 Fundal Height: 29 cm Movement: Present     General:  Alert, oriented and cooperative. Patient is in no acute distress.  Skin: Skin is warm and dry. No rash noted.   Cardiovascular: Normal heart rate noted  Respiratory: Normal respiratory effort, no problems with respiration noted  Abdomen: Soft, gravid, appropriate for gestational age. Pain/Pressure: Present     Pelvic:  Cervical exam deferred        Extremities: Normal range of motion.  Edema: None  Mental Status: Normal mood and affect. Normal behavior. Normal judgment and thought content.     Assessment   19 y.o. G1P0000 at [redacted]w[redacted]d by  06/03/2021, by  Ultrasound presenting for routine prenatal visit  Plan   pregnancy 1 Problems (from 11/23/20 to present)    Problem Noted Resolved   Asymptomatic bacteriuria during pregnancy in first trimester 11/30/2020 by 01/28/2021, CNM No   Encounter for supervision of normal first pregnancy in first trimester 11/23/2020 by 11/25/2020, CNM No   Overview Addendum 03/22/2021 11:49 AM by 03/24/2021, MD     Nursing Staff Provider  Office Location  [facility] Dating   14 wk Natale Milch  Language  English Anatomy US   complete  Flu Vaccine  12/08/2019 Genetic Screen  NIPS: normal xx    TDaP vaccine   03/22/2021 Hgb A1C or  GTT Third trimester : 101  Rhogam   not needed   LAB RESULTS   Feeding Plan  formula Blood Type B/Positive/-- (12/27 0905)   Contraception  declines Antibody Negative (12/27 0905)  Circumcision  Rubella 10.10 (12/27 0905)  Pediatrician   RPR Non Reactive (12/27 0905)   Support Person  [place] HBsAg Negative (12/27 0905)   Prenatal Classes  discussed HIV Non Reactive (12/27 0905)    Varicella immune  BTL Consent  GBS  (For PCN allergy, check sensitivities)        VBAC Consent  Pap  under 21 at NOB [ ]  postpartum pap    Hgb Electro   negative  Covid Considering vaccination CF negative     SMA  negative              Previous Version      TDAP today Gestational age appropriate obstetric precautions including but not limited to vaginal bleeding, contractions, leaking of fluid and fetal movement were reviewed in detail with the patient.    Return  in about 2 weeks (around 04/05/2021) for ROB in person.  Natale Milch MD [facility], [facility] 03/22/2021, 11:49 AM

## 2021-03-22 NOTE — Patient Instructions (Signed)

## 2021-04-02 ENCOUNTER — Observation Stay
Admission: EM | Admit: 2021-04-02 | Discharge: 2021-04-02 | Disposition: A | Discharge status: Home / Self Care | Payer: Medicaid Other | Attending: Obstetrics & Gynecology | Admitting: Obstetrics & Gynecology

## 2021-04-02 ENCOUNTER — Encounter: Payer: Self-pay | Admitting: Obstetrics & Gynecology

## 2021-04-02 ENCOUNTER — Other Ambulatory Visit: Payer: Self-pay

## 2021-04-02 DIAGNOSIS — R1030 Lower abdominal pain, unspecified: Secondary | ICD-10-CM

## 2021-04-02 DIAGNOSIS — O99891 Other specified diseases and conditions complicating pregnancy: Secondary | ICD-10-CM | POA: Diagnosis not present

## 2021-04-02 DIAGNOSIS — O26893 Other specified pregnancy related conditions, third trimester: Secondary | ICD-10-CM | POA: Diagnosis not present

## 2021-04-02 DIAGNOSIS — R1032 Left lower quadrant pain: Secondary | ICD-10-CM | POA: Diagnosis not present

## 2021-04-02 DIAGNOSIS — Z3A31 31 weeks gestation of pregnancy: Secondary | ICD-10-CM | POA: Diagnosis not present

## 2021-04-02 DIAGNOSIS — R8271 Bacteriuria: Secondary | ICD-10-CM

## 2021-04-02 DIAGNOSIS — R1031 Right lower quadrant pain: Secondary | ICD-10-CM | POA: Diagnosis not present

## 2021-04-02 DIAGNOSIS — Z3401 Encounter for supervision of normal first pregnancy, first trimester: Secondary | ICD-10-CM

## 2021-04-02 DIAGNOSIS — O26899 Other specified pregnancy related conditions, unspecified trimester: Secondary | ICD-10-CM | POA: Diagnosis present

## 2021-04-02 LAB — COMPREHENSIVE METABOLIC PANEL
ALT: 15 U/L (ref 0–44)
AST: 24 U/L (ref 15–41)
Albumin: 3.1 g/dL — ABNORMAL LOW (ref 3.5–5.0)
Alkaline Phosphatase: 109 U/L (ref 38–126)
Anion gap: 8 (ref 5–15)
BUN: 13 mg/dL (ref 6–20)
CO2: 24 mmol/L (ref 22–32)
Calcium: 8.6 mg/dL — ABNORMAL LOW (ref 8.9–10.3)
Chloride: 104 mmol/L (ref 98–111)
Creatinine, Ser: 0.81 mg/dL (ref 0.44–1.00)
GFR, Estimated: >60 mL/min (ref >60)
Glucose, Bld: 97 mg/dL (ref 70–99)
Potassium: 3.7 mmol/L (ref 3.5–5.1)
Sodium: 136 mmol/L (ref 135–145)
Total Bilirubin: 0.7 mg/dL (ref 0.3–1.2)
Total Protein: 6.3 g/dL — ABNORMAL LOW (ref 6.5–8.1)

## 2021-04-02 LAB — URINALYSIS, ROUTINE W REFLEX MICROSCOPIC
Bilirubin Urine: NEGATIVE
Glucose, UA: NEGATIVE mg/dL
Hgb urine dipstick: NEGATIVE
Ketones, ur: NEGATIVE mg/dL
Leukocytes,Ua: NEGATIVE
Nitrite: NEGATIVE
Protein, ur: NEGATIVE mg/dL
Specific Gravity, Urine: 1.025 (ref 1.005–1.030)
pH: 7 (ref 5.0–8.0)

## 2021-04-02 LAB — WET PREP, GENITAL
Clue Cells Wet Prep HPF POC: NONE SEEN
Sperm: NONE SEEN
Trich, Wet Prep: NONE SEEN
Yeast Wet Prep HPF POC: NONE SEEN

## 2021-04-02 LAB — PROTEIN / CREATININE RATIO, URINE
Creatinine, Urine: 178 mg/dL
Protein Creatinine Ratio: 0.07 mg/mg{Cre} (ref 0.00–0.15)
Total Protein, Urine: 13 mg/dL

## 2021-04-02 LAB — CBC
HCT: 34.1 % — ABNORMAL LOW (ref 36.0–46.0)
Hemoglobin: 11.7 g/dL — ABNORMAL LOW (ref 12.0–15.0)
MCH: 34.8 pg — ABNORMAL HIGH (ref 26.0–34.0)
MCHC: 34.3 g/dL (ref 30.0–36.0)
MCV: 101.5 fL — ABNORMAL HIGH (ref 80.0–100.0)
Platelets: 207 10*3/uL (ref 150–400)
RBC: 3.36 MIL/uL — ABNORMAL LOW (ref 3.87–5.11)
RDW: 12.6 % (ref 11.5–15.5)
WBC: 11.3 10*3/uL — ABNORMAL HIGH (ref 4.0–10.5)
nRBC: 0 % (ref 0.0–0.2)

## 2021-04-02 LAB — RUPTURE OF MEMBRANE (ROM)PLUS: Rom Plus: NEGATIVE

## 2021-04-02 MED ORDER — ACETAMINOPHEN 325 MG PO TABS: 650.0000 mg | ORAL_TABLET | ORAL | Status: DC | PRN

## 2021-04-02 MED ORDER — LIDOCAINE HCL (PF) 1 % IJ SOLN: 30.0000 mL | INTRAMUSCULAR | Status: DC | PRN

## 2021-04-02 MED ORDER — ONDANSETRON HCL 4 MG/2ML IJ SOLN: 4.0000 mg | Freq: Four times a day (QID) | INTRAMUSCULAR | Status: DC | PRN

## 2021-04-02 NOTE — OB Triage Note (Signed)
Discharge instructions and test results reviewed with the patient. Pt verbalized understanding and consented to discharge. All questions answered and pt stable at time of discharge. Red flag labor symptoms reviewed.

## 2021-04-02 NOTE — OB Triage Note (Addendum)
Pt is a G1P0 and [redacted]w[redacted]d presenting to L&D with complaint of lower abdominal pressure and cramping. Pt rates pain 6/10 on a 0-10 pain scale. Pt denies any interventions to alleviate pain. Pt reports discomfort started on 04/02/21 at 1700. Pt states "I feel like I constantly have to pee and maybe she is pushing on my bladder but I always feel like that." Pt states she went to the bathroom at 1710 and noticed clear fluid in her underwear. Pt denies leaking since that encounter. Pt also states that her "feet, hands and ankles" have swollen this past week. Pt denies HA, epigastric pain, or visual changes and RN assessed negative homan's sign and clonus. Pt confirms positive fetal movement and and denies vaginal bleeding. Pt denies recent intercourse. Monitors applied and assessing. Elevated blood pressure with initial reading, RN to cycle blood pressures.

## 2021-04-05 ENCOUNTER — Other Ambulatory Visit: Payer: Self-pay

## 2021-04-05 ENCOUNTER — Encounter: Payer: Self-pay | Admitting: Obstetrics and Gynecology

## 2021-04-05 ENCOUNTER — Telehealth: Payer: Self-pay

## 2021-04-05 ENCOUNTER — Ambulatory Visit (INDEPENDENT_AMBULATORY_CARE_PROVIDER_SITE_OTHER): Payer: Medicaid Other | Admitting: Obstetrics and Gynecology

## 2021-04-05 VITALS — BP 124/70 | Ht 68.0 in

## 2021-04-05 DIAGNOSIS — Z3403 Encounter for supervision of normal first pregnancy, third trimester: Secondary | ICD-10-CM

## 2021-04-05 DIAGNOSIS — Z3A31 31 weeks gestation of pregnancy: Secondary | ICD-10-CM

## 2021-04-05 LAB — POCT URINALYSIS DIPSTICK OB
Glucose, UA: NEGATIVE
POC,PROTEIN,UA: NEGATIVE

## 2021-04-05 NOTE — Patient Instructions (Signed)

## 2021-04-05 NOTE — Final Progress Note (Signed)
Physician Final Progress Note  Patient ID: SHEREKA LAFORTUNE MRN: [id number] DOB/AGE: 08-10-2001 20 y.o.  Admit date: 04/02/2021 Admitting provider: Nadara Mustard, MD Discharge date: 04/05/2021  Admission Diagnoses: Pregnancy related bilateral lower abdominal pain, antepartum   31 weeks pregnancy  Discharge Diagnoses:  Active Problems:   Pregnancy related bilateral lower abdominal pain, antepartum   31 weeks pregnancy  Consults: None  Significant Findings/ Diagnostic Studies: Patient presented for evaluation of labor.  Patient had cervical exam by RN and this was reported to me. I reviewed her vital signs and fetal tracing, both of which were reassuring.  Patient was discharge as she was not laboring.  Procedures: A NST procedure was performed with FHR monitoring and a normal baseline established, appropriate time of 20-40 minutes of evaluation, and accels >2 seen w 15x15 characteristics.  Results show a REACTIVE NST.   Discharge Condition: good  Disposition: Discharge disposition: 01-Home or Self Care       Diet: Regular diet  Discharge Activity: Activity as tolerated   Allergies as of 04/02/2021      Reactions   Bactrim [sulfamethoxazole-trimethoprim] Rash   Rash, dizzy, nausea      Medication List    ASK your doctor about these medications   multivitamin-prenatal 27-0.8 MG Tabs tablet Take 1 tablet by mouth daily at 12 noon.        Total time spent taking care of this patient: TRIAGE ONLY  Signed: Fujie Dickison 04/05/2021, 8:14 AM

## 2021-04-05 NOTE — Discharge Summary (Signed)
  See FPN 

## 2021-04-05 NOTE — Progress Notes (Signed)
Routine Prenatal Care Visit  Subjective  Casey Garza is a 20 y.o. G1P0000 at [redacted]w[redacted]d being seen today for ongoing prenatal care.  She is currently monitored for the following issues for this low-risk pregnancy and has Vitamin D deficiency disease; Acne; Dysmenorrhea; Encounter for supervision of normal first pregnancy in third trimester; Asymptomatic bacteriuria during pregnancy in first trimester; Labor and delivery, indication for care; [redacted] weeks gestation of pregnancy; and Pregnancy related bilateral lower abdominal pain, antepartum on their problem list.  ----------------------------------------------------------------------------------- Patient reports she has been having bilateral swelling of her feet which worsens when she is working, but then resolves. She is having difficulty with working because she has to be on her feet a lot while working.  Contractions: Not present. Vag. Bleeding: None.  Movement: Present. Denies leaking of fluid.  ----------------------------------------------------------------------------------- The following portions of the patient's history were reviewed and updated as appropriate: allergies, current medications, past family history, past medical history, past social history, past surgical history and problem list. Problem list updated.   Objective  Blood pressure 124/70, height 5\' 8"  (1.727 m), last menstrual period 09/12/2020. Pregravid weight 150 lb (68 kg) Total Weight Gain 50 lb (22.7 kg) Urinalysis:      Fetal Status: Fetal Heart Rate (bpm): 145 Fundal Height: 31 cm Movement: Present     General:  Alert, oriented and cooperative. Patient is in no acute distress.  Skin: Skin is warm and dry. No rash noted.   Cardiovascular: Normal heart rate noted  Respiratory: Normal respiratory effort, no problems with respiration noted  Abdomen: Soft, gravid, appropriate for gestational age. Pain/Pressure: Absent     Pelvic:  Cervical exam deferred         Extremities: Normal range of motion.  Edema: Trace  Mental Status: Normal mood and affect. Normal behavior. Normal judgment and thought content.     Assessment   19 y.o. G1P0000 at [redacted]w[redacted]d by  06/03/2021, by Ultrasound presenting for routine prenatal visit  Plan   pregnancy 1 Problems (from 11/23/20 to present)    Problem Noted Resolved   Asymptomatic bacteriuria during pregnancy in first trimester 11/30/2020 by 01/28/2021, CNM No   Encounter for supervision of normal first pregnancy in third trimester 11/23/2020 by 11/25/2020, CNM No   Overview Addendum 03/22/2021 11:49 AM by 03/24/2021, MD     Nursing Staff Provider  Office Location  [facility] Dating   14 wk Natale Milch  Language  English Anatomy US   complete  Flu Vaccine  12/08/2019 Genetic Screen  NIPS: normal xx    TDaP vaccine   03/22/2021 Hgb A1C or  GTT Third trimester : 101  Rhogam   not needed   LAB RESULTS   Feeding Plan  formula Blood Type B/Positive/-- (12/27 0905)   Contraception  declines Antibody Negative (12/27 0905)  Circumcision  Rubella 10.10 (12/27 0905)  Pediatrician   RPR Non Reactive (12/27 0905)   Support Person  [place] HBsAg Negative (12/27 0905)   Prenatal Classes  discussed HIV Non Reactive (12/27 0905)    Varicella immune  BTL Consent  GBS  (For PCN allergy, check sensitivities)        VBAC Consent  Pap  under 21 at NOB [ ]  postpartum pap    Hgb Electro   negative  Covid Considering vaccination CF negative     SMA  negative              Previous Version  She has some interest about being taken out of work. We discussed that she does not currently have a medical indication to be off of work. I could provide a generic note to start pregnancy leave , but her FMLA may not be covered until there is a medical indication and starting her FMLA early would limit the amount of time she has paid leave after delivery.  Encouraged her to discuss with her provider/ HR department if any  accomodation can be made for her work situation.   Gestational age appropriate obstetric precautions including but not limited to vaginal bleeding, contractions, leaking of fluid and fetal movement were reviewed in detail with the patient.    Return in about 2 weeks (around 04/19/2021) for ROB in person.  Natale Milch MD [facility], [facility] 04/05/2021, 11:55 AM

## 2021-04-05 NOTE — Telephone Encounter (Signed)
Transition Care Management Unsuccessful Follow-up Telephone Call  Date of discharge and from where:  04/02/21 [facility]  Attempts:  1st Attempt  Reason for unsuccessful TCM follow-up call:  Left voice message

## 2021-04-19 ENCOUNTER — Other Ambulatory Visit: Payer: Self-pay

## 2021-04-19 ENCOUNTER — Encounter: Payer: Self-pay | Admitting: Obstetrics and Gynecology

## 2021-04-19 ENCOUNTER — Ambulatory Visit (INDEPENDENT_AMBULATORY_CARE_PROVIDER_SITE_OTHER): Payer: Medicaid Other | Admitting: Obstetrics and Gynecology

## 2021-04-19 VITALS — BP 116/70 | Ht 68.0 in | Wt 218.2 lb

## 2021-04-19 DIAGNOSIS — Z3403 Encounter for supervision of normal first pregnancy, third trimester: Secondary | ICD-10-CM

## 2021-04-19 DIAGNOSIS — O1203 Gestational edema, third trimester: Secondary | ICD-10-CM | POA: Diagnosis not present

## 2021-04-19 DIAGNOSIS — O26843 Uterine size-date discrepancy, third trimester: Secondary | ICD-10-CM

## 2021-04-19 DIAGNOSIS — Z3A33 33 weeks gestation of pregnancy: Secondary | ICD-10-CM

## 2021-04-19 LAB — POCT URINALYSIS DIPSTICK OB: Glucose, UA: NEGATIVE

## 2021-04-19 NOTE — Progress Notes (Signed)
Routine Prenatal Care Visit  Subjective  Casey Garza is a 20 y.o. G1P0000 at [redacted]w[redacted]d being seen today for ongoing prenatal care.  She is currently monitored for the following issues for this low-risk pregnancy and has Vitamin D deficiency disease; Acne; Dysmenorrhea; Encounter for supervision of normal first pregnancy in third trimester; Asymptomatic bacteriuria during pregnancy in first trimester; Labor and delivery, indication for care; [redacted] weeks gestation of pregnancy; and Pregnancy related bilateral lower abdominal pain, antepartum on their problem list.  ----------------------------------------------------------------------------------- Patient reports no complaints.   Contractions: Irregular. Vag. Bleeding: None.  Movement: Present. Denies leaking of fluid.  ----------------------------------------------------------------------------------- The following portions of the patient's history were reviewed and updated as appropriate: allergies, current medications, past family history, past medical history, past social history, past surgical history and problem list. Problem list updated.   Objective  Blood pressure 116/70, height 5\' 8"  (1.727 m), weight 218 lb 3.2 oz (99 kg), last menstrual period 09/12/2020. Pregravid weight 150 lb (68 kg) Total Weight Gain 68 lb 3.2 oz (30.9 kg) Urinalysis:      Fetal Status: Fetal Heart Rate (bpm): 150 Fundal Height: 30 cm Movement: Present     General:  Alert, oriented and cooperative. Patient is in no acute distress.  Skin: Skin is warm and dry. No rash noted.   Cardiovascular: Normal heart rate noted  Respiratory: Normal respiratory effort, no problems with respiration noted  Abdomen: Soft, gravid, appropriate for gestational age. Pain/Pressure: Present     Pelvic:  Cervical exam deferred        Extremities: Normal range of motion.  Edema: Mild pitting, slight indentation  Mental Status: Normal mood and affect. Normal behavior. Normal judgment  and thought content.     Assessment   19 y.o. G1P0000 at [redacted]w[redacted]d by  06/03/2021, by Ultrasound presenting for routine prenatal visit  Plan   pregnancy 1 Problems (from 11/23/20 to present)    Problem Noted Resolved   Asymptomatic bacteriuria during pregnancy in first trimester 11/30/2020 by 01/28/2021, CNM No   Encounter for supervision of normal first pregnancy in third trimester 11/23/2020 by 11/25/2020, CNM No   Overview Addendum 03/22/2021 11:49 AM by 03/24/2021, MD     Nursing Staff Provider  Office Location  [facility] Dating   14 wk Natale Milch  Language  English Anatomy US   complete  Flu Vaccine  12/08/2019 Genetic Screen  NIPS: normal xx    TDaP vaccine   03/22/2021 Hgb A1C or  GTT Third trimester : 101  Rhogam   not needed   LAB RESULTS   Feeding Plan  formula Blood Type B/Positive/-- (12/27 0905)   Contraception  declines Antibody Negative (12/27 0905)  Circumcision  Rubella 10.10 (12/27 0905)  Pediatrician   RPR Non Reactive (12/27 0905)   Support Person  [place] HBsAg Negative (12/27 0905)   Prenatal Classes  discussed HIV Non Reactive (12/27 0905)    Varicella immune  BTL Consent  GBS  (For PCN allergy, check sensitivities)        VBAC Consent  Pap  under 21 at NOB [ ]  postpartum pap    Hgb Electro   negative  Covid Considering vaccination CF negative     SMA  negative              Previous Version      Bilateral swelling- patient elevating feet at night, recently purchased compression socks. Pre-e labs today for swelling Uterine size date discrepancy will order  growth [place] and AFI.   Gestational age appropriate obstetric precautions including but not limited to vaginal bleeding, contractions, leaking of fluid and fetal movement were reviewed in detail with the patient.    Return in about 1 week (around 04/26/2021) for ROB in person.  Natale Milch MD [facility], [facility] 04/19/2021, 11:58 AM

## 2021-04-19 NOTE — Patient Instructions (Signed)
Preeclampsia and Eclampsia Preeclampsia is a serious condition that may develop during pregnancy. This condition involves high blood pressure during pregnancy and causes symptoms such as headaches, vision changes, and increased swelling in the legs, hands, and face. Preeclampsia occurs after 20 weeks of pregnancy. Eclampsia is a seizure that happens from worsening preeclampsia. Diagnosing and managing preeclampsia early is important. If not treated early, it can cause serious problems for mother and baby. There is no cure for this condition. However, during pregnancy, delivering the baby may be the best treatment for preeclampsia or eclampsia. For most women, symptoms of preeclampsia and eclampsia go away after giving birth. In rare cases, a woman may develop preeclampsia or eclampsia after giving birth. This usually occurs within 48 hours after childbirth but may occur up to 6 weeks after giving birth. What are the causes? The cause of this condition is not known. What increases the risk? The following factors make you more likely to develop preeclampsia:  Being pregnant for the first time or being pregnant with multiples.  Having had preeclampsia or a condition called hemolysis, elevated liver enzymes, and low platelet count (HELLP)syndrome during a past pregnancy.  Having a family history of preeclampsia.  Being older than age 35.  Being obese.  Becoming pregnant through fertility treatments. Conditions that reduce blood flow or oxygen to your placenta and baby may also increase your risk. These include:  High blood pressure before, during, or immediately following pregnancy.  Kidney disease.  Diabetes.  Blood clotting disorders.  Autoimmune diseases, such as lupus.  Sleep apnea. What are the signs or symptoms? Common symptoms of this condition include:  A severe, throbbing headache that does not go away.  Vision problems, such as blurred or double vision and light  sensitivity.  Pain in the stomach, especially the right upper region.  Pain in the shoulder. Other symptoms that may develop as the condition gets worse include:  Sudden weight gain because of fluid buildup in the body. This causes swelling of the face, hands, legs, and feet.  Severe nausea and vomiting.  Urinating less than usual.  Shortness of breath.  Seizures. How is this diagnosed? Your health care provider will ask you about symptoms and check for signs of preeclampsia during your prenatal visits. You will also have routine tests, including:  Checking your blood pressure.  Urine tests to check for protein.  Blood tests to assess your organ function.  Monitoring your baby's heart rate.  Ultrasounds to check fetal growth.   How is this treated? You and your health care provider will determine the treatment that is best for you. Treatment may include:  Frequent prenatal visits to check for preeclampsia.  Medicine to lower your blood pressure.  Medicine to prevent seizures.  Low-dose aspirin during your pregnancy.  Staying in the hospital, in severe cases. You will be given medicines to control your blood pressure and the amount of fluids in your body.  Delivering your baby. Work with your health care provider to manage any chronic health conditions, such as diabetes or kidney problems. Also, work with your health care provider to manage weight gain during pregnancy. Follow these instructions at home: Eating and drinking  Drink enough fluid to keep your urine pale yellow.  Avoid caffeine. Caffeine may increase blood pressure and heart rate and lead to dehydration.  Reduce the amount of salt that you eat. Lifestyle  Do not use any products that contain nicotine or tobacco. These products include cigarettes, chewing tobacco, and   vaping devices, such as e-cigarettes. If you need help quitting, ask your health care provider.  Do not use alcohol or drugs.  Avoid  stress as much as possible.  Rest and get plenty of sleep. General instructions  Take over-the-counter and prescription medicines only as told by your health care provider.  When lying down, lie on your left side. This keeps pressure off your major blood vessels.  When sitting or lying down, raise (elevate) your feet. Try putting pillows underneath your lower legs.  Exercise regularly. Ask your health care provider what kinds of exercise are best for you.  Check your blood pressure as often as recommended by your health care provider.  Keep all prenatal and follow-up visits. This is important.   Contact a health care provider if:  You have symptoms that may need treatment or closer monitoring. These include: ? Headaches. ? Stomach pain or nausea and vomiting. ? Shoulder pain. ? Vision problems, such as spots in front of your eyes or blurry vision. ? Sudden weight gain or increased swelling in your face, hands, legs, and feet. ? Increased anxiety or feeling of impending doom. ? Signs or symptoms of labor. Get help right away if:  You have any of the following symptoms: ? A seizure. ? Shortness of breath or trouble breathing. ? Trouble speaking or slurred speech. ? Fainting. ? Chest pain. These symptoms may represent a serious problem that is an emergency. Do not wait to see if the symptoms will go away. Get medical help right away. Call your local emergency services (911 in the [place] Do not drive yourself to the hospital. Summary  Preeclampsia is a serious condition that may develop during pregnancy.  Diagnosing and treating preeclampsia early is very important.  Keep all prenatal and follow-up visits. This is important.  Get help right away if you have a seizure, shortness of breath or trouble breathing, trouble speaking or slurred speech, chest pain, or fainting. This information is not intended to replace advice given to you by your health care provider. Make sure you  discuss any questions you have with your health care provider. Document Revised: 08/06/2020 Document Reviewed: 08/06/2020 Elsevier Patient Education  2021 [employer]vier Inc.  

## 2021-04-20 ENCOUNTER — Telehealth: Payer: Self-pay

## 2021-04-20 LAB — CBC WITH DIFFERENTIAL
Basophils Absolute: 0 10*3/uL (ref 0.0–0.2)
Basos: 0 %
EOS (ABSOLUTE): 0 10*3/uL (ref 0.0–0.4)
Eos: 0 %
Hematocrit: 39 % (ref 34.0–46.6)
Hemoglobin: 13.1 g/dL (ref 11.1–15.9)
Immature Grans (Abs): 0 10*3/uL (ref 0.0–0.1)
Immature Granulocytes: 0 %
Lymphocytes Absolute: 1.7 10*3/uL (ref 0.7–3.1)
Lymphs: 22 %
MCH: 33.4 pg — ABNORMAL HIGH (ref 26.6–33.0)
MCHC: 33.6 g/dL (ref 31.5–35.7)
MCV: 100 fL — ABNORMAL HIGH (ref 79–97)
Monocytes Absolute: 0.6 10*3/uL (ref 0.1–0.9)
Monocytes: 8 %
Neutrophils Absolute: 5.3 10*3/uL (ref 1.4–7.0)
Neutrophils: 70 %
RBC: 3.92 x10E6/uL (ref 3.77–5.28)
RDW: 12.1 % (ref 11.7–15.4)
WBC: 7.7 10*3/uL (ref 3.4–10.8)

## 2021-04-20 LAB — COMPREHENSIVE METABOLIC PANEL
ALT: 15 IU/L (ref 0–32)
AST: 17 IU/L (ref 0–40)
Albumin/Globulin Ratio: 1.4 (ref 1.2–2.2)
Albumin: 3.7 g/dL — ABNORMAL LOW (ref 3.9–5.0)
Alkaline Phosphatase: 196 IU/L — ABNORMAL HIGH (ref 42–106)
BUN/Creatinine Ratio: 15 (ref 9–23)
BUN: 14 mg/dL (ref 6–20)
Bilirubin Total: 0.2 mg/dL (ref 0.0–1.2)
CO2: 20 mmol/L (ref 20–29)
Calcium: 9 mg/dL (ref 8.7–10.2)
Chloride: 105 mmol/L (ref 96–106)
Creatinine, Ser: 0.93 mg/dL (ref 0.57–1.00)
Globulin, Total: 2.7 g/dL (ref 1.5–4.5)
Glucose: 98 mg/dL (ref 65–99)
Potassium: 4.1 mmol/L (ref 3.5–5.2)
Sodium: 141 mmol/L (ref 134–144)
Total Protein: 6.4 g/dL (ref 6.0–8.5)
eGFR: 90 mL/min/{1.73_m2} (ref >59)

## 2021-04-20 NOTE — Telephone Encounter (Signed)
Her labs are largely normal. Not suggestive of preeclampsia, although I am waiting for her protein creatinine ratio to result which will likely be this afternoon.

## 2021-04-20 NOTE — Telephone Encounter (Signed)
Busy

## 2021-04-20 NOTE — Telephone Encounter (Signed)
Pt calling; wants her lab work explained to her.  [phone]

## 2021-04-21 ENCOUNTER — Other Ambulatory Visit: Payer: Self-pay

## 2021-04-21 ENCOUNTER — Encounter: Payer: Self-pay | Admitting: Obstetrics and Gynecology

## 2021-04-21 ENCOUNTER — Inpatient Hospital Stay
Admission: EM | Admit: 2021-04-21 | Discharge: 2021-04-26 | DRG: 787 | Disposition: A | Discharge status: Home / Self Care | Payer: Medicaid Other | Attending: Obstetrics and Gynecology | Admitting: Obstetrics and Gynecology

## 2021-04-21 ENCOUNTER — Other Ambulatory Visit: Payer: Self-pay | Admitting: Obstetrics and Gynecology

## 2021-04-21 ENCOUNTER — Ambulatory Visit
Admission: RE | Admit: 2021-04-21 | Discharge: 2021-04-21 | Disposition: A | Discharge status: Home / Self Care | Payer: Medicaid Other | Source: Ambulatory Visit | Attending: Obstetrics and Gynecology | Admitting: Obstetrics and Gynecology

## 2021-04-21 DIAGNOSIS — Z3403 Encounter for supervision of normal first pregnancy, third trimester: Secondary | ICD-10-CM

## 2021-04-21 DIAGNOSIS — O163 Unspecified maternal hypertension, third trimester: Secondary | ICD-10-CM | POA: Diagnosis present

## 2021-04-21 DIAGNOSIS — Z3A34 34 weeks gestation of pregnancy: Secondary | ICD-10-CM

## 2021-04-21 DIAGNOSIS — O9081 Anemia of the puerperium: Secondary | ICD-10-CM | POA: Diagnosis not present

## 2021-04-21 DIAGNOSIS — O99891 Other specified diseases and conditions complicating pregnancy: Secondary | ICD-10-CM

## 2021-04-21 DIAGNOSIS — O26843 Uterine size-date discrepancy, third trimester: Secondary | ICD-10-CM | POA: Insufficient documentation

## 2021-04-21 DIAGNOSIS — R8271 Bacteriuria: Secondary | ICD-10-CM

## 2021-04-21 DIAGNOSIS — O3660X3 Maternal care for excessive fetal growth, unspecified trimester, fetus 3: Secondary | ICD-10-CM | POA: Diagnosis not present

## 2021-04-21 DIAGNOSIS — O1414 Severe pre-eclampsia complicating childbirth: Principal | ICD-10-CM | POA: Diagnosis present

## 2021-04-21 DIAGNOSIS — O365930 Maternal care for other known or suspected poor fetal growth, third trimester, not applicable or unspecified: Secondary | ICD-10-CM | POA: Diagnosis present

## 2021-04-21 DIAGNOSIS — Z20822 Contact with and (suspected) exposure to covid-19: Secondary | ICD-10-CM | POA: Diagnosis present

## 2021-04-21 DIAGNOSIS — D62 Acute posthemorrhagic anemia: Secondary | ICD-10-CM | POA: Diagnosis not present

## 2021-04-21 DIAGNOSIS — O99824 Streptococcus B carrier state complicating childbirth: Secondary | ICD-10-CM | POA: Diagnosis present

## 2021-04-21 DIAGNOSIS — O365931 Maternal care for other known or suspected poor fetal growth, third trimester, fetus 1: Secondary | ICD-10-CM

## 2021-04-21 DIAGNOSIS — O1413 Severe pre-eclampsia, third trimester: Secondary | ICD-10-CM | POA: Diagnosis present

## 2021-04-21 MED ORDER — HYDRALAZINE HCL 20 MG/ML IJ SOLN
10.0000 mg | INTRAMUSCULAR | Status: DC | PRN
  Administered 2021-04-22: 10 mg via INTRAVENOUS
  Filled 2021-04-21: qty 1

## 2021-04-21 MED ORDER — DIPHENHYDRAMINE HCL 50 MG/ML IJ SOLN
25.0000 mg | Freq: Once | INTRAMUSCULAR | Status: AC
  Administered 2021-04-22: 25 mg via INTRAVENOUS
  Filled 2021-04-21: qty 1

## 2021-04-21 MED ORDER — PROCHLORPERAZINE EDISYLATE 10 MG/2ML IJ SOLN
10.0000 mg | Freq: Once | INTRAMUSCULAR | Status: AC
  Administered 2021-04-22: 10 mg via INTRAVENOUS
  Filled 2021-04-21 (×2): qty 2

## 2021-04-21 MED ORDER — LABETALOL HCL 5 MG/ML IV SOLN
INTRAVENOUS | Status: AC
  Filled 2021-04-21: qty 20

## 2021-04-21 MED ORDER — LABETALOL HCL 5 MG/ML IV SOLN
40.0000 mg | INTRAVENOUS | Status: DC | PRN
  Administered 2021-04-22: 40 mg via INTRAVENOUS
  Filled 2021-04-21: qty 8

## 2021-04-21 MED ORDER — LABETALOL HCL 5 MG/ML IV SOLN
80.0000 mg | INTRAVENOUS | Status: DC | PRN
  Administered 2021-04-22: 80 mg via INTRAVENOUS

## 2021-04-21 MED ORDER — MAGNESIUM SULFATE 40 GM/1000ML IV SOLN
INTRAVENOUS | Status: AC
  Filled 2021-04-21: qty 1000

## 2021-04-21 MED ORDER — LABETALOL HCL 5 MG/ML IV SOLN
20.0000 mg | INTRAVENOUS | Status: DC | PRN
  Administered 2021-04-23: 20 mg via INTRAVENOUS
  Filled 2021-04-21: qty 4

## 2021-04-21 MED ORDER — LABETALOL HCL 5 MG/ML IV SOLN
INTRAVENOUS | Status: AC
  Administered 2021-04-21: 20 mg
  Filled 2021-04-21: qty 4

## 2021-04-21 MED ORDER — LACTATED RINGERS IV SOLN: INTRAVENOUS | Status: DC

## 2021-04-21 NOTE — OB Triage Note (Signed)
Pt reports to unit c/o a headache that aggravates with light since 0900 and nausea and cramping since about an hour ago. Pt also reports swelling in her feet and shortness of breath. Pt reports +FM, denies vaginal bleeding and LOF. Pt denies right epigastric pain, floaters, blurry vision. Upon RN assessment, +1 pitting edema in bilateral lower extremities and +2 reflexes.

## 2021-04-22 ENCOUNTER — Encounter: Payer: Self-pay | Admitting: Advanced Practice Midwife

## 2021-04-22 DIAGNOSIS — Z3A34 34 weeks gestation of pregnancy: Secondary | ICD-10-CM | POA: Diagnosis not present

## 2021-04-22 DIAGNOSIS — O1413 Severe pre-eclampsia, third trimester: Secondary | ICD-10-CM | POA: Diagnosis not present

## 2021-04-22 DIAGNOSIS — O99824 Streptococcus B carrier state complicating childbirth: Secondary | ICD-10-CM | POA: Diagnosis not present

## 2021-04-22 DIAGNOSIS — Z20822 Contact with and (suspected) exposure to covid-19: Secondary | ICD-10-CM | POA: Diagnosis not present

## 2021-04-22 DIAGNOSIS — O365930 Maternal care for other known or suspected poor fetal growth, third trimester, not applicable or unspecified: Secondary | ICD-10-CM | POA: Diagnosis present

## 2021-04-22 DIAGNOSIS — O9081 Anemia of the puerperium: Secondary | ICD-10-CM | POA: Diagnosis not present

## 2021-04-22 DIAGNOSIS — O365931 Maternal care for other known or suspected poor fetal growth, third trimester, fetus 1: Secondary | ICD-10-CM | POA: Diagnosis not present

## 2021-04-22 DIAGNOSIS — O1414 Severe pre-eclampsia complicating childbirth: Secondary | ICD-10-CM | POA: Diagnosis not present

## 2021-04-22 DIAGNOSIS — R030 Elevated blood-pressure reading, without diagnosis of hypertension: Secondary | ICD-10-CM | POA: Diagnosis not present

## 2021-04-22 DIAGNOSIS — D62 Acute posthemorrhagic anemia: Secondary | ICD-10-CM | POA: Diagnosis not present

## 2021-04-22 LAB — CBC
HCT: 36.3 % (ref 36.0–46.0)
Hemoglobin: 12.4 g/dL (ref 12.0–15.0)
MCH: 34.2 pg — ABNORMAL HIGH (ref 26.0–34.0)
MCHC: 34.2 g/dL (ref 30.0–36.0)
MCV: 100 fL (ref 80.0–100.0)
Platelets: 207 10*3/uL (ref 150–400)
RBC: 3.63 MIL/uL — ABNORMAL LOW (ref 3.87–5.11)
RDW: 12.4 % (ref 11.5–15.5)
WBC: 8.1 10*3/uL (ref 4.0–10.5)
nRBC: 0 % (ref 0.0–0.2)

## 2021-04-22 LAB — COMPREHENSIVE METABOLIC PANEL
ALT: 16 U/L (ref 0–44)
AST: 26 U/L (ref 15–41)
Albumin: 2.7 g/dL — ABNORMAL LOW (ref 3.5–5.0)
Alkaline Phosphatase: 159 U/L — ABNORMAL HIGH (ref 38–126)
Anion gap: 9 (ref 5–15)
BUN: 15 mg/dL (ref 6–20)
CO2: 21 mmol/L — ABNORMAL LOW (ref 22–32)
Calcium: 8.8 mg/dL — ABNORMAL LOW (ref 8.9–10.3)
Chloride: 106 mmol/L (ref 98–111)
Creatinine, Ser: 0.97 mg/dL (ref 0.44–1.00)
GFR, Estimated: >60 mL/min (ref >60)
Glucose, Bld: 107 mg/dL — ABNORMAL HIGH (ref 70–99)
Potassium: 3.9 mmol/L (ref 3.5–5.1)
Sodium: 136 mmol/L (ref 135–145)
Total Bilirubin: 0.8 mg/dL (ref 0.3–1.2)
Total Protein: 6 g/dL — ABNORMAL LOW (ref 6.5–8.1)

## 2021-04-22 LAB — SAMPLE TO BLOOD BANK

## 2021-04-22 LAB — RESP PANEL BY RT-PCR (FLU A&B, COVID) ARPGX2
Influenza A by PCR: NEGATIVE
Influenza B by PCR: NEGATIVE
SARS Coronavirus 2 by RT PCR: NEGATIVE

## 2021-04-22 LAB — TYPE AND SCREEN
ABO/RH(D): B POS
Antibody Screen: NEGATIVE

## 2021-04-22 LAB — GROUP B STREP BY PCR: Group B strep by PCR: POSITIVE — AB

## 2021-04-22 LAB — ABO/RH: ABO/RH(D): B POS

## 2021-04-22 LAB — PROTEIN / CREATININE RATIO, URINE
Creatinine, Urine: 266 mg/dL
Protein Creatinine Ratio: 4.38 mg/mg{Cre} — ABNORMAL HIGH (ref 0.00–0.15)
Total Protein, Urine: 1166 mg/dL

## 2021-04-22 LAB — RPR: RPR Ser Ql: NONREACTIVE

## 2021-04-22 MED ORDER — FUROSEMIDE 10 MG/ML IJ SOLN
20.0000 mg | Freq: Once | INTRAMUSCULAR | Status: AC
  Administered 2021-04-22: 20 mg via INTRAVENOUS
  Filled 2021-04-22: qty 2

## 2021-04-22 MED ORDER — LIDOCAINE HCL (PF) 1 % IJ SOLN
30.0000 mL | INTRAMUSCULAR | Status: DC | PRN
  Filled 2021-04-22: qty 30

## 2021-04-22 MED ORDER — ONDANSETRON HCL 4 MG/2ML IJ SOLN: 4.0000 mg | Freq: Four times a day (QID) | INTRAMUSCULAR | Status: DC | PRN

## 2021-04-22 MED ORDER — TERBUTALINE SULFATE 1 MG/ML IJ SOLN
0.2500 mg | Freq: Once | INTRAMUSCULAR | Status: AC | PRN
  Administered 2021-04-22: 0.25 mg via SUBCUTANEOUS
  Filled 2021-04-22: qty 1

## 2021-04-22 MED ORDER — LACTATED RINGERS IV SOLN
500.0000 mL | INTRAVENOUS | Status: DC | PRN
  Administered 2021-04-22 (×2): 250 mL via INTRAVENOUS

## 2021-04-22 MED ORDER — AMMONIA AROMATIC IN INHA
RESPIRATORY_TRACT | Status: AC
  Filled 2021-04-22: qty 10

## 2021-04-22 MED ORDER — LIDOCAINE HCL (PF) 1 % IJ SOLN
INTRAMUSCULAR | Status: AC
  Filled 2021-04-22: qty 30

## 2021-04-22 MED ORDER — PENICILLIN G POT IN DEXTROSE 60000 UNIT/ML IV SOLN
3.0000 10*6.[IU] | INTRAVENOUS | Status: DC
  Administered 2021-04-22 (×4): 3 10*6.[IU] via INTRAVENOUS
  Filled 2021-04-22 (×4): qty 50

## 2021-04-22 MED ORDER — TERBUTALINE SULFATE 1 MG/ML IJ SOLN: 0.2500 mg | Freq: Once | INTRAMUSCULAR | Status: DC | PRN

## 2021-04-22 MED ORDER — SODIUM CHLORIDE 0.9 % IV SOLN
5.0000 10*6.[IU] | Freq: Once | INTRAVENOUS | Status: AC
  Administered 2021-04-22: 5 10*6.[IU] via INTRAVENOUS
  Filled 2021-04-22: qty 5

## 2021-04-22 MED ORDER — MISOPROSTOL 25 MCG QUARTER TABLET
25.0000 ug | ORAL_TABLET | ORAL | Status: DC | PRN
  Administered 2021-04-22 (×2): 25 ug via VAGINAL
  Filled 2021-04-22 (×2): qty 1

## 2021-04-22 MED ORDER — OXYTOCIN 10 UNIT/ML IJ SOLN
INTRAMUSCULAR | Status: AC
  Filled 2021-04-22: qty 2

## 2021-04-22 MED ORDER — NIFEDIPINE ER OSMOTIC RELEASE 30 MG PO TB24: 30.0000 mg | ORAL_TABLET | Freq: Every day | ORAL | Status: DC

## 2021-04-22 MED ORDER — OXYTOCIN-SODIUM CHLORIDE 30-0.9 UT/500ML-% IV SOLN
1.0000 m[IU]/min | INTRAVENOUS | Status: DC
  Administered 2021-04-22: 3 m[IU]/min via INTRAVENOUS
  Administered 2021-04-22: 2 m[IU]/min via INTRAVENOUS
  Administered 2021-04-22: 1 m[IU]/min via INTRAVENOUS
  Administered 2021-04-22: 4 m[IU]/min via INTRAVENOUS

## 2021-04-22 MED ORDER — CALCIUM GLUCONATE 10 % IV SOLN
INTRAVENOUS | Status: AC
  Filled 2021-04-22: qty 10

## 2021-04-22 MED ORDER — MAGNESIUM SULFATE BOLUS VIA INFUSION
4.0000 g | Freq: Once | INTRAVENOUS | Status: AC
  Administered 2021-04-22: 4 g via INTRAVENOUS
  Filled 2021-04-22: qty 1000

## 2021-04-22 MED ORDER — MISOPROSTOL 200 MCG PO TABS
ORAL_TABLET | ORAL | Status: AC
  Filled 2021-04-22: qty 4

## 2021-04-22 MED ORDER — MAGNESIUM SULFATE 40 GM/1000ML IV SOLN
2.0000 g/h | INTRAVENOUS | Status: AC
  Administered 2021-04-22: 2 g/h via INTRAVENOUS
  Filled 2021-04-22: qty 1000

## 2021-04-22 MED ORDER — NIFEDIPINE ER OSMOTIC RELEASE 30 MG PO TB24
30.0000 mg | ORAL_TABLET | Freq: Every day | ORAL | Status: DC
  Administered 2021-04-22: 30 mg via ORAL
  Filled 2021-04-22 (×2): qty 1

## 2021-04-22 MED ORDER — ACETAMINOPHEN 325 MG PO TABS
650.0000 mg | ORAL_TABLET | ORAL | Status: DC | PRN
  Administered 2021-04-22: 650 mg via ORAL
  Filled 2021-04-22: qty 2

## 2021-04-22 MED ORDER — BETAMETHASONE SOD PHOS & ACET 6 (3-3) MG/ML IJ SUSP
12.0000 mg | INTRAMUSCULAR | Status: AC
  Administered 2021-04-22 – 2021-04-23 (×2): 12 mg via INTRAMUSCULAR
  Filled 2021-04-22 (×2): qty 5

## 2021-04-22 MED ORDER — OXYTOCIN BOLUS FROM INFUSION: 333.0000 mL | Freq: Once | INTRAVENOUS | Status: DC

## 2021-04-22 MED ORDER — NIFEDIPINE 10 MG PO CAPS
10.0000 mg | ORAL_CAPSULE | Freq: Four times a day (QID) | ORAL | Status: DC
  Administered 2021-04-22 (×2): 10 mg via ORAL
  Filled 2021-04-22 (×2): qty 1

## 2021-04-22 MED ORDER — OXYTOCIN-SODIUM CHLORIDE 30-0.9 UT/500ML-% IV SOLN
2.5000 [IU]/h | INTRAVENOUS | Status: DC
  Administered 2021-04-23: 2.5 [IU]/h via INTRAVENOUS
  Filled 2021-04-22: qty 1000
  Filled 2021-04-22 (×2): qty 500

## 2021-04-22 NOTE — Progress Notes (Signed)
Labor Progress Note  Casey Garza is a 20 y.o. G1P0000 at [redacted]w[redacted]d by 14 week ultrasound admitted for induction of labor due to preeclampsia with severe features.  Subjective: Patient is resting in bed. She reports intermittent cramping sensation. She denies HA at this time. She reports no additional concerns.  Objective: BP 136/71   Pulse (!) 109   Temp 98.3 F (36.8 C) (Oral)   Resp 18   Ht 5\' 8"  (1.727 m)   Wt 98.9 kg   LMP 09/12/2020 (Approximate)   SpO2 99%   BMI 33.15 kg/m   Fetal Assessment:  FHT:  FHR: 135 bpm, variability: minimal to moderate,  accelerations:  Abscent,  decelerations:  Present prolonged decels noted with contraction pattern tripleting - intermittent late decel noted Category/reactivity:  Category II UC:   Irregular pattern - tripleting/quadrupling pattern, every 1-4 minutes - palpate mild SVE:  1/60/-2  Membrane status: Intact Amniotic color: n/a  Labs: Lab Results  Component Value Date   WBC 8.1 04/21/2021   HGB 12.4 04/21/2021   HCT 36.3 04/21/2021   MCV 100.0 04/21/2021   PLT 207 04/21/2021    Assessment / Plan: Induction of labor due to preeclampsia with severe features, s/p cytotec x 2 doses  Labor: Foley bulb placed - will monitor ctx pattern - pitocin when indicated Preeclampsia:  on magnesium sulfate Fetal Wellbeing:  Category II - s/p terbutaline inj and IV fluid bolus - will monitor closely Pain Control:  Labor support without medications I/D:  GBS positive; Covid-19 negative Anticipated MOD:  NSVD  04/23/2021, CNM 04/22/2021, 11:13 AM

## 2021-04-22 NOTE — Progress Notes (Addendum)
Update to H&P  Patient admitted for induction based on severe range blood pressures, headache, elevated P/C ratio and increased edema/weight gain in the past 2 weeks.   IUGR diagnosed yesterday by growth scan ultrasound. See report below.  Results for Casey Garza, Casey Garza (MRN [id number]) as of 04/22/2021 01:56  Ref. Range 04/21/2021 23:31 04/21/2021 23:32 04/21/2021 23:59  Sodium Latest Ref Range: 135 - 145 mmol/L 136    Potassium Latest Ref Range: 3.5 - 5.1 mmol/L 3.9    Chloride Latest Ref Range: 98 - 111 mmol/L 106    CO2 Latest Ref Range: 22 - 32 mmol/L 21 (L)    Glucose Latest Ref Range: 70 - 99 mg/dL 347 (H)    BUN Latest Ref Range: 6 - 20 mg/dL 15    Creatinine Latest Ref Range: 0.44 - 1.00 mg/dL 4.25    Calcium Latest Ref Range: 8.9 - 10.3 mg/dL 8.8 (L)    Anion gap Latest Ref Range: 5 - 15  9    Alkaline Phosphatase Latest Ref Range: 38 - 126 U/L 159 (H)    Albumin Latest Ref Range: 3.5 - 5.0 g/dL 2.7 (L)    AST Latest Ref Range: 15 - 41 U/L 26    ALT Latest Ref Range: 0 - 44 U/L 16    Total Protein Latest Ref Range: 6.5 - 8.1 g/dL 6.0 (L)    Total Bilirubin Latest Ref Range: 0.3 - 1.2 mg/dL 0.8    GFR, Estimated Latest Ref Range: >60 mL/min >60    WBC Latest Ref Range: 4.0 - 10.5 K/uL 8.1    RBC Latest Ref Range: 3.87 - 5.11 MIL/uL 3.63 (L)    Hemoglobin Latest Ref Range: 12.0 - 15.0 g/dL 95.6    HCT Latest Ref Range: 36.0 - 46.0 % 36.3    MCV Latest Ref Range: 80.0 - 100.0 fL 100.0    MCH Latest Ref Range: 26.0 - 34.0 pg 34.2 (H)    MCHC Latest Ref Range: 30.0 - 36.0 g/dL 38.7    RDW Latest Ref Range: 11.5 - 15.5 % 12.4    Platelets Latest Ref Range: 150 - 400 K/uL 207    nRBC Latest Ref Range: 0.0 - 0.2 % 0.0    Blood Bank Specimen Unknown   SAMPLE AVAILABLE FOR TESTING  Sample Expiration Unknown   04/24/2021,2359...  RESP PANEL BY RT-PCR (FLU A&B, COVID) ARPGX2 Unknown   Rpt  Influenza A By PCR Latest Ref Range: NEGATIVE    NEGATIVE  Influenza B By PCR Latest Ref Range:  NEGATIVE    NEGATIVE  SARS Coronavirus 2 by RT PCR Latest Ref Range: NEGATIVE    NEGATIVE  Total Protein, Urine Latest Units: mg/dL  5,643   Protein Creatinine Ratio Latest Ref Range: 0.00 - 0.15 mg/mgCre  4.38 (H)   Creatinine, Urine Latest Units: mg/dL  329     CLINICAL DATA:  Pregnancy. Fetal growth determination. AFI determination.  EXAM: 04/21/2021 OBSTETRICAL ULTRASOUND >14 WKS  FINDINGS: Number of Fetuses: 1  Heart Rate:  155 bpm  Movement: Present  Presentation: Cephalic  Previa: No  Placental Location: Anterior  Amniotic Fluid (Subjective): Normal  Amniotic Fluid (Objective):  Vertical pocket = 5.5cm  AFI = 17.5 cm (5%ile= 8.1 cm, 95%= 24.8 cm for 34 wks)  FETAL BIOMETRY  BPD: 7.5cm 30w 1d  HC:   28.1cm 30w 5d  AC:   28.0cm 32w 0d  FL:   6.4cm 32w 6d  Current Mean GA: 31w 6d [place] EDC: 06/16/2021  Assigned GA:  33w 6d Assigned EDC: 06/03/2021  Estimated Fetal Weight:  1878.6g 5.8%ile  FETAL ANATOMY  Lateral Ventricles: Appears normal  Thalami/CSP: Appears normal  Posterior Fossa:  Not visualized  Nuchal Region: Not visualized  Upper Lip: Appears normal  Spine: Appears normal  4 Chamber Heart on Left: Appears normal  LVOT: Appears normal  RVOT: Appears normal  Stomach on Left: Appears normal  3 Vessel Cord: Appears normal  Cord Insertion site: Not visualized  Kidneys: Appears normal  Bladder: Appears normal  Extremities: Appears normal  Technically difficult due to: Fetal position and advanced gestational age  Maternal Findings:  Cervix:  3.4 cm and closed  IMPRESSION: 1. Single viable intrauterine pregnancy at 31 weeks 6 days. Cephalic presentation. Fetal heart rate 155 beats per minute. Estimated fetal weight is 1878.6 g which is 5.8 percentile. This suggest IUGR.  2.  AFI of 17.5.  3. Fetal posterior fossa and nuchal region not visualized. Follow-up exam should be  considered.  Electronically Signed: ByMaisie Fus  Register On: 04/21/2021 12:40   Tresea Mall, CNM

## 2021-04-22 NOTE — H&P (Signed)
OB History & Physical   History of Present Illness:  Chief Complaint: headache, elevated blood pressure on home cuff, lower extremity swelling  HPI:  Casey Garza is a 20 y.o. G1P0000 female at [redacted]w[redacted]d dated by 14 week ultrasound.  Her pregnancy has been complicated by asymptomatic bacteriuria in first trimester, lower abdominal pain in second trimester, lower extremity swelling in third trimester, growth scan ordered recently for size/date discrepancy.    She denies contractions.   She denies leakage of fluid.   She denies vaginal bleeding.   She reports fetal movement.  She reports some pelvic cramping.  Her headache started yesterday morning and worsened over the day. She checked her blood pressure on her Aunt's cuff with elevated readings 170s over 100. She denies visual changes or epigastric pain. She has noted lower extremity swelling for the past couple weeks with normal labs to this point. Her blood pressure has been normal until now and she denies essential or chronic hypertension. She took 1 extra strength Tylenol during the day.  Total weight gain for pregnancy: 30.8 kg   Obstetrical Problem List: pregnancy 1 Problems (from 11/23/20 to present)    Problem Noted Resolved   Asymptomatic bacteriuria during pregnancy in first trimester 11/30/2020 by Zipporah Plants, CNM No   Encounter for supervision of normal first pregnancy in third trimester 11/23/2020 by Zipporah Plants, CNM No   Overview Addendum 03/22/2021 11:49 AM by Natale Milch, MD     Nursing Staff Provider  Office Location  [facility] Dating   14 wk [place]  Language  English Anatomy US   complete  Flu Vaccine  12/08/2019 Genetic Screen  NIPS: normal xx    TDaP vaccine   03/22/2021 Hgb A1C or  GTT Third trimester : 101  Rhogam   not needed   LAB RESULTS   Feeding Plan  formula Blood Type B/Positive/-- (12/27 0905)   Contraception  declines Antibody Negative (12/27 0905)  Circumcision  Rubella 10.10 (12/27 0905)  Pediatrician    RPR Non Reactive (12/27 0905)   Support Person  [place] HBsAg Negative (12/27 0905)   Prenatal Classes  discussed HIV Non Reactive (12/27 0905)    Varicella immune  BTL Consent  GBS  (For PCN allergy, check sensitivities)        VBAC Consent  Pap  under 21 at NOB [ ]  postpartum pap    Hgb Electro   negative  Covid Considering vaccination CF negative     SMA  negative              Previous Version       Maternal Medical History:   Past Medical History:  Diagnosis Date  . Acne   . Vitamin D deficiency disease     Past Surgical History:  Procedure Laterality Date  . NO PAST SURGERIES      Allergies  Allergen Reactions  . Bactrim [Sulfamethoxazole-Trimethoprim] Rash    Rash, dizzy, nausea    Prior to Admission medications   Medication Sig Start Date End Date Taking? Authorizing Provider  Prenatal Vit-Fe Fumarate-FA (MULTIVITAMIN-PRENATAL) 27-0.8 MG TABS tablet Take 1 tablet by mouth daily at 12 noon.   Yes [provider]    OB History  Gravida Para Term Preterm AB Living  1 0 0 0 0 0  SAB IAB Ectopic Multiple Live Births  0 0 0 0 0    # Outcome Date GA Lbr Len/2nd Weight Sex Delivery Anes PTL Lv  1 Current  Prenatal care site: [facility]  Social History: She  reports that she has never smoked. She has never used smokeless tobacco. She reports previous drug use. She reports that she does not drink alcohol.  Family History: family history includes Diabetes in her maternal grandmother; Hypertension in her maternal grandmother.    Review of Systems:  Review of Systems  Constitutional: Negative for chills and fever.  HENT: Negative for congestion, ear discharge, ear pain, hearing loss, sinus pain and sore throat.   Eyes: Negative for blurred vision and double vision.  Respiratory: Negative for cough, shortness of breath and wheezing.   Cardiovascular: Positive for leg swelling. Negative for chest pain and palpitations.   Gastrointestinal: Positive for abdominal pain. Negative for blood in stool, constipation, diarrhea, heartburn, melena, nausea and vomiting.  Genitourinary: Negative for dysuria, flank pain, frequency, hematuria and urgency.  Musculoskeletal: Negative for back pain, joint pain and myalgias.  Skin: Negative for itching and rash.  Neurological: Positive for headaches. Negative for dizziness, tingling, tremors, sensory change, speech change, focal weakness, seizures, loss of consciousness and weakness.  Endo/Heme/Allergies: Negative for environmental allergies. Does not bruise/bleed easily.  Psychiatric/Behavioral: Negative for depression, hallucinations, memory loss, substance abuse and suicidal ideas. The patient is not nervous/anxious and does not have insomnia.      Physical Exam:  BP (!) 137/105   Pulse 96   Temp 98.9 F (37.2 C) (Oral)   Resp 15   Ht 5\' 8"  (1.727 m)   Wt 98.9 kg   LMP 09/12/2020 (Approximate)   SpO2 99%   BMI 33.15 kg/m   Constitutional: Well nourished, well developed female in mild distress with headache.  HEENT: normal Skin: Warm and dry.  Cardiovascular: Regular rate and rhythm.   Extremity: clonus, trace to 1+ edema Respiratory: Clear to auscultation bilateral. Normal respiratory effort Abdomen: FHT present Neuro: DTRs 2+, Cranial nerves grossly intact Psych: Alert and Oriented x3. No memory deficits. Normal mood and affect.    Patient Vitals for the past 24 hrs:  BP Temp Temp src Pulse Resp SpO2 Height Weight  04/22/21 0100 -- -- -- -- 14 99 % -- --  04/22/21 0058 (!) 150/96 -- -- 95 -- -- -- --  04/22/21 0053 (!) 141/108 -- -- (!) 101 -- -- -- --  04/22/21 0050 -- -- -- -- -- 100 % -- --  04/22/21 0049 (!) 172/94 -- -- (!) 104 -- -- -- --  04/22/21 0045 -- -- -- -- -- 99 % -- --  04/22/21 0043 (!) 157/92 -- -- 93 -- -- -- --  04/22/21 0038 (!) 156/101 -- -- 97 -- -- -- --  04/22/21 0033 (!) 137/105 -- -- 96 -- -- -- --  04/22/21 0032 (!) 153/103  -- -- 97 -- -- -- --  04/22/21 0028 (!) 150/126 -- -- 97 -- -- -- --  04/22/21 0025 -- -- -- -- -- 99 % -- --  04/22/21 0024 (!) 147/112 -- -- (!) 101 -- -- -- --  04/22/21 0018 (!) 157/107 -- -- (!) 101 -- -- -- --  04/22/21 0013 (!) 157/92 -- -- 94 -- -- -- --  04/22/21 0008 (!) 159/102 -- -- 94 -- -- -- --  04/22/21 0003 (!) 168/113 -- -- 80 -- -- -- --  04/21/21 2358 (!) 160/104 -- -- 82 -- -- -- --  04/21/21 2343 (!) 169/123 -- -- 88 -- -- -- --  04/21/21 2336 (!) 172/105 -- -- 76 -- -- -- --  04/21/21 2329 (!) 166/105 98.9 F (37.2 C) Oral 74 15 -- 5\' 8"  (1.727 m) 98.9 kg    Baseline FHR: 135 beats/min   Variability: moderate   Accelerations: present  Decelerations: absent Contractions: absent  Overall assessment: reassuring   Lab Results  Component Value Date   SARSCOV2NAA POSITIVE (A) 11/02/2020   Current Covid test pending  Assessment:  SINCERE BERLANGA is a 20 y.o. G71P0000 female at [redacted]w[redacted]d with severe range blood pressures on admission, waiting for lab results.   Plan:  1. Admit to observation 2. CBC, CMP, P/C ratio, IV fluids, magnesium sulfate seizure prophylaxis 3. Labetalol protocol for severe range BP, Procardia 10 mg IR Q6hr 4. Fetal well-being: reactive NST, Betamethasone Q24hr x2 doses 5. Benadryl/Compazine headache cocktail    [redacted]w[redacted]d, CNM 04/22/2021 12:43 AM

## 2021-04-22 NOTE — Consult Note (Signed)
[facility]  Prenatal Consult       04/22/2021  12:43 PM  I was asked by Dr. Bonney Aid to consult on this patient for anticipated preterm delivery. I had the pleasure of meeting with Ms. Rosengren and her partner today. She is a 20 year old G1P0 woman at 54 weeks' gestation. She is expecting a baby girl, to be named Raelani. Ms. Kuntzman is being induced due to pre-eclampsia with severe features. New diagnosis of fetal IUGR on admission (~6%ile); of note, pregnancy dating was by a 14 week ultrasound. Prenatal genetic screenings were normal.  I explained that the neonatal intensive care team would be present for the delivery and outlined the likely delivery room course for this baby including routine resuscitation and NRP-guided approaches to the treatment of respiratory distress. We discussed other common problems associated with prematurity including respiratory distress syndrome, feeding issues, and temperature regulation. We briefly discussed the possible developmental outcomes for a baby at this gestational age.  We discussed the average length of stay but I noted that the actual LOS would depend on the severity of problems encountered and response to treatments. We discussed visitation policies and the resources available while her child is in the hospital.  We discussed the importance of good nutrition and various methods of providing nutrition (parenteral hyperalimentation, gavage feedings and/or oral feeding). Mother has already decided to formula feed. I counseled re: benefits of human milk and the possibility of using donor breast milk as a bridge, which she declined. She prefers formula feedings.  Ms. Hegner and her partner were engaged throughout the consultation and I answered their questions. Thank you for involving [place] in the care of this patient. A member of our team will be available should the family have additional questions. Time for consultation approximately 20  minutes.  Jacob Moores, MD Neonatal Medicine

## 2021-04-23 ENCOUNTER — Inpatient Hospital Stay: Payer: Medicaid Other | Admitting: Anesthesiology

## 2021-04-23 ENCOUNTER — Encounter: Payer: Self-pay | Admitting: Obstetrics and Gynecology

## 2021-04-23 ENCOUNTER — Encounter
Admission: EM | Disposition: A | Discharge status: Home / Self Care | Payer: Self-pay | Source: Home / Self Care | Attending: Obstetrics and Gynecology

## 2021-04-23 DIAGNOSIS — Z3A34 34 weeks gestation of pregnancy: Secondary | ICD-10-CM | POA: Diagnosis not present

## 2021-04-23 DIAGNOSIS — O1414 Severe pre-eclampsia complicating childbirth: Principal | ICD-10-CM

## 2021-04-23 DIAGNOSIS — O365930 Maternal care for other known or suspected poor fetal growth, third trimester, not applicable or unspecified: Secondary | ICD-10-CM | POA: Diagnosis not present

## 2021-04-23 DIAGNOSIS — O1413 Severe pre-eclampsia, third trimester: Secondary | ICD-10-CM | POA: Diagnosis not present

## 2021-04-23 LAB — COMPREHENSIVE METABOLIC PANEL
ALT: 16 U/L (ref 0–44)
ALT: 14 U/L (ref 0–44)
AST: 29 U/L (ref 15–41)
AST: 28 U/L (ref 15–41)
Albumin: 2.9 g/dL — ABNORMAL LOW (ref 3.5–5.0)
Albumin: 2.7 g/dL — ABNORMAL LOW (ref 3.5–5.0)
Alkaline Phosphatase: 160 U/L — ABNORMAL HIGH (ref 38–126)
Alkaline Phosphatase: 145 U/L — ABNORMAL HIGH (ref 38–126)
Anion gap: 11 (ref 5–15)
Anion gap: 9 (ref 5–15)
BUN: 18 mg/dL (ref 6–20)
BUN: 19 mg/dL (ref 6–20)
CO2: 19 mmol/L — ABNORMAL LOW (ref 22–32)
CO2: 20 mmol/L — ABNORMAL LOW (ref 22–32)
Calcium: 7.7 mg/dL — ABNORMAL LOW (ref 8.9–10.3)
Calcium: 7.4 mg/dL — ABNORMAL LOW (ref 8.9–10.3)
Chloride: 101 mmol/L (ref 98–111)
Chloride: 103 mmol/L (ref 98–111)
Creatinine, Ser: 1.3 mg/dL — ABNORMAL HIGH (ref 0.44–1.00)
Creatinine, Ser: 1.29 mg/dL — ABNORMAL HIGH (ref 0.44–1.00)
GFR, Estimated: >60 mL/min (ref >60)
GFR, Estimated: >60 mL/min (ref >60)
Glucose, Bld: 111 mg/dL — ABNORMAL HIGH (ref 70–99)
Glucose, Bld: 136 mg/dL — ABNORMAL HIGH (ref 70–99)
Potassium: 4.7 mmol/L (ref 3.5–5.1)
Potassium: 5.1 mmol/L (ref 3.5–5.1)
Sodium: 131 mmol/L — ABNORMAL LOW (ref 135–145)
Sodium: 132 mmol/L — ABNORMAL LOW (ref 135–145)
Total Bilirubin: 0.6 mg/dL (ref 0.3–1.2)
Total Bilirubin: 0.7 mg/dL (ref 0.3–1.2)
Total Protein: 6.4 g/dL — ABNORMAL LOW (ref 6.5–8.1)
Total Protein: 6.1 g/dL — ABNORMAL LOW (ref 6.5–8.1)

## 2021-04-23 LAB — CBC
HCT: 37.6 % (ref 36.0–46.0)
HCT: 37.2 % (ref 36.0–46.0)
HCT: 37.3 % (ref 36.0–46.0)
Hemoglobin: 12.5 g/dL (ref 12.0–15.0)
Hemoglobin: 12.8 g/dL (ref 12.0–15.0)
Hemoglobin: 12.7 g/dL (ref 12.0–15.0)
MCH: 33.2 pg (ref 26.0–34.0)
MCH: 34.2 pg — ABNORMAL HIGH (ref 26.0–34.0)
MCH: 34 pg (ref 26.0–34.0)
MCHC: 33.2 g/dL (ref 30.0–36.0)
MCHC: 34.4 g/dL (ref 30.0–36.0)
MCHC: 34 g/dL (ref 30.0–36.0)
MCV: 100 fL (ref 80.0–100.0)
MCV: 99.5 fL (ref 80.0–100.0)
MCV: 99.7 fL (ref 80.0–100.0)
Platelets: 227 10*3/uL (ref 150–400)
Platelets: 234 10*3/uL (ref 150–400)
Platelets: 241 10*3/uL (ref 150–400)
RBC: 3.76 MIL/uL — ABNORMAL LOW (ref 3.87–5.11)
RBC: 3.74 MIL/uL — ABNORMAL LOW (ref 3.87–5.11)
RBC: 3.74 MIL/uL — ABNORMAL LOW (ref 3.87–5.11)
RDW: 12.9 % (ref 11.5–15.5)
RDW: 13.1 % (ref 11.5–15.5)
RDW: 13 % (ref 11.5–15.5)
WBC: 17.6 10*3/uL — ABNORMAL HIGH (ref 4.0–10.5)
WBC: 21.7 10*3/uL — ABNORMAL HIGH (ref 4.0–10.5)
WBC: 19.9 10*3/uL — ABNORMAL HIGH (ref 4.0–10.5)
nRBC: 0 % (ref 0.0–0.2)
nRBC: 0 % (ref 0.0–0.2)
nRBC: 0 % (ref 0.0–0.2)

## 2021-04-23 LAB — MAGNESIUM
Magnesium: 7.8 mg/dL (ref 1.7–2.4)
Magnesium: 5.8 mg/dL — ABNORMAL HIGH (ref 1.7–2.4)

## 2021-04-23 SURGERY — Surgical Case
Anesthesia: Spinal

## 2021-04-23 MED ORDER — ONDANSETRON HCL 4 MG/2ML IJ SOLN
INTRAMUSCULAR | Status: AC
  Filled 2021-04-23: qty 2

## 2021-04-23 MED ORDER — FERROUS SULFATE 325 (65 FE) MG PO TABS
325.0000 mg | ORAL_TABLET | Freq: Two times a day (BID) | ORAL | Status: DC
  Administered 2021-04-23 – 2021-04-26 (×6): 325 mg via ORAL
  Filled 2021-04-23 (×6): qty 1

## 2021-04-23 MED ORDER — DIBUCAINE (PERIANAL) 1 % EX OINT: 1.0000 "application " | TOPICAL_OINTMENT | CUTANEOUS | Status: DC | PRN

## 2021-04-23 MED ORDER — WITCH HAZEL-GLYCERIN EX PADS: 1.0000 "application " | MEDICATED_PAD | CUTANEOUS | Status: DC | PRN

## 2021-04-23 MED ORDER — FENTANYL CITRATE (PF) 100 MCG/2ML IJ SOLN
25.0000 ug | INTRAMUSCULAR | Status: DC | PRN
  Administered 2021-04-23: 25 ug via INTRAVENOUS
  Filled 2021-04-23: qty 2

## 2021-04-23 MED ORDER — SOD CITRATE-CITRIC ACID 500-334 MG/5ML PO SOLN
30.0000 mL | ORAL | Status: DC
  Filled 2021-04-23: qty 15

## 2021-04-23 MED ORDER — BUPIVACAINE HCL (PF) 0.5 % IJ SOLN
INTRAMUSCULAR | Status: AC
  Filled 2021-04-23: qty 30

## 2021-04-23 MED ORDER — ONDANSETRON HCL 4 MG/2ML IJ SOLN: 4.0000 mg | Freq: Once | INTRAMUSCULAR | Status: DC | PRN

## 2021-04-23 MED ORDER — CARBOPROST TROMETHAMINE 250 MCG/ML IM SOLN
INTRAMUSCULAR | Status: AC
  Filled 2021-04-23: qty 1

## 2021-04-23 MED ORDER — ONDANSETRON HCL 4 MG/2ML IJ SOLN
INTRAMUSCULAR | Status: DC | PRN
  Administered 2021-04-23: 4 mg via INTRAVENOUS

## 2021-04-23 MED ORDER — SIMETHICONE 80 MG PO CHEW: 80.0000 mg | CHEWABLE_TABLET | ORAL | Status: DC | PRN

## 2021-04-23 MED ORDER — NIFEDIPINE ER OSMOTIC RELEASE 30 MG PO TB24: 60.0000 mg | ORAL_TABLET | Freq: Every day | ORAL | Status: DC

## 2021-04-23 MED ORDER — NIFEDIPINE ER OSMOTIC RELEASE 30 MG PO TB24
30.0000 mg | ORAL_TABLET | Freq: Once | ORAL | Status: AC
  Administered 2021-04-23: 30 mg via ORAL
  Filled 2021-04-23: qty 1

## 2021-04-23 MED ORDER — OXYTOCIN-SODIUM CHLORIDE 30-0.9 UT/500ML-% IV SOLN
2.5000 [IU]/h | INTRAVENOUS | Status: AC
  Administered 2021-04-23: 2.5 [IU]/h via INTRAVENOUS

## 2021-04-23 MED ORDER — MORPHINE SULFATE (PF) 0.5 MG/ML IJ SOLN
INTRAMUSCULAR | Status: DC | PRN
  Administered 2021-04-23: .1 mg via INTRATHECAL

## 2021-04-23 MED ORDER — PHENYLEPHRINE HCL (PRESSORS) 10 MG/ML IV SOLN
INTRAVENOUS | Status: AC
  Filled 2021-04-23: qty 1

## 2021-04-23 MED ORDER — MAGNESIUM SULFATE 40 GM/1000ML IV SOLN
1.0000 g/h | INTRAVENOUS | Status: DC
  Administered 2021-04-23: 1 g/h via INTRAVENOUS
  Filled 2021-04-23: qty 1000

## 2021-04-23 MED ORDER — LABETALOL HCL 5 MG/ML IV SOLN: 20.0000 mg | INTRAVENOUS | Status: DC | PRN

## 2021-04-23 MED ORDER — BUPIVACAINE HCL (PF) 0.5 % IJ SOLN
INTRAMUSCULAR | Status: DC | PRN
Start: 2021-04-23 — End: 2021-04-23
  Administered 2021-04-23: 10 mL

## 2021-04-23 MED ORDER — TRANEXAMIC ACID-NACL 1000-0.7 MG/100ML-% IV SOLN
INTRAVENOUS | Status: AC
  Filled 2021-04-23: qty 100

## 2021-04-23 MED ORDER — SIMETHICONE 80 MG PO CHEW
80.0000 mg | CHEWABLE_TABLET | Freq: Three times a day (TID) | ORAL | Status: DC
  Administered 2021-04-23 – 2021-04-26 (×9): 80 mg via ORAL
  Filled 2021-04-23 (×9): qty 1

## 2021-04-23 MED ORDER — OXYTOCIN-SODIUM CHLORIDE 30-0.9 UT/500ML-% IV SOLN
INTRAVENOUS | Status: DC | PRN
  Administered 2021-04-23: 30 [IU] via INTRAVENOUS

## 2021-04-23 MED ORDER — CEFAZOLIN SODIUM-DEXTROSE 2-4 GM/100ML-% IV SOLN
2.0000 g | INTRAVENOUS | Status: AC
  Administered 2021-04-23: 2 g via INTRAVENOUS
  Filled 2021-04-23: qty 100

## 2021-04-23 MED ORDER — SENNOSIDES-DOCUSATE SODIUM 8.6-50 MG PO TABS
2.0000 | ORAL_TABLET | ORAL | Status: DC
  Administered 2021-04-23 – 2021-04-26 (×4): 2 via ORAL
  Filled 2021-04-23 (×4): qty 2

## 2021-04-23 MED ORDER — LABETALOL HCL 5 MG/ML IV SOLN: 40.0000 mg | INTRAVENOUS | Status: DC | PRN

## 2021-04-23 MED ORDER — MORPHINE SULFATE (PF) 0.5 MG/ML IJ SOLN
INTRAMUSCULAR | Status: AC
  Filled 2021-04-23: qty 10

## 2021-04-23 MED ORDER — COCONUT OIL OIL
1.0000 "application " | TOPICAL_OIL | Status: DC | PRN
  Filled 2021-04-23: qty 120

## 2021-04-23 MED ORDER — LACTATED RINGERS IV SOLN: INTRAVENOUS | Status: DC

## 2021-04-23 MED ORDER — FLEET ENEMA 7-19 GM/118ML RE ENEM: 1.0000 | ENEMA | Freq: Every day | RECTAL | Status: DC | PRN

## 2021-04-23 MED ORDER — FENTANYL CITRATE (PF) 100 MCG/2ML IJ SOLN
INTRAMUSCULAR | Status: DC | PRN
  Administered 2021-04-23: 15 ug via INTRATHECAL

## 2021-04-23 MED ORDER — HYDRALAZINE HCL 20 MG/ML IJ SOLN: 10.0000 mg | INTRAMUSCULAR | Status: DC | PRN

## 2021-04-23 MED ORDER — FENTANYL CITRATE (PF) 100 MCG/2ML IJ SOLN
INTRAMUSCULAR | Status: AC
  Filled 2021-04-23: qty 2

## 2021-04-23 MED ORDER — BUPIVACAINE 0.25 % ON-Q PUMP DUAL CATH 400 ML
400.0000 mL | INJECTION | Status: AC
  Filled 2021-04-23: qty 400

## 2021-04-23 MED ORDER — SODIUM CHLORIDE 0.9 % IV SOLN
INTRAVENOUS | Status: DC | PRN
  Administered 2021-04-23: 10 ug/min via INTRAVENOUS

## 2021-04-23 MED ORDER — TETRACAINE HCL 1 % IJ SOLN
INTRAMUSCULAR | Status: DC | PRN
  Administered 2021-04-23: 1 mg via INTRASPINAL

## 2021-04-23 MED ORDER — MAGNESIUM SULFATE 40 GM/1000ML IV SOLN
INTRAVENOUS | Status: DC | PRN
  Administered 2021-04-23: 1 g/h via INTRAVENOUS

## 2021-04-23 MED ORDER — BUPIVACAINE ON-Q PAIN PUMP (FOR ORDER SET NO CHG)
INJECTION | Status: DC
  Filled 2021-04-23 (×6): qty 1

## 2021-04-23 MED ORDER — BUPIVACAINE IN DEXTROSE 0.75-8.25 % IT SOLN
INTRATHECAL | Status: DC | PRN
  Administered 2021-04-23: 1.5 mL via INTRATHECAL

## 2021-04-23 MED ORDER — LABETALOL HCL 5 MG/ML IV SOLN: 80.0000 mg | INTRAVENOUS | Status: DC | PRN

## 2021-04-23 MED ORDER — IBUPROFEN 600 MG PO TABS
600.0000 mg | ORAL_TABLET | Freq: Four times a day (QID) | ORAL | Status: DC
  Administered 2021-04-23 – 2021-04-26 (×10): 600 mg via ORAL
  Filled 2021-04-23 (×10): qty 1

## 2021-04-23 MED ORDER — BISACODYL 10 MG RE SUPP
10.0000 mg | Freq: Every day | RECTAL | Status: DC | PRN
  Filled 2021-04-23: qty 1

## 2021-04-23 MED ORDER — PRENATAL MULTIVITAMIN CH
1.0000 | ORAL_TABLET | Freq: Every day | ORAL | Status: DC
  Administered 2021-04-23 – 2021-04-26 (×4): 1 via ORAL
  Filled 2021-04-23 (×4): qty 1

## 2021-04-23 SURGICAL SUPPLY — 26 items
CELL SAVER LIPIGURD (MISCELLANEOUS) ×1 IMPLANT
CHLORAPREP W/TINT 26 (MISCELLANEOUS) ×4 IMPLANT
DERMABOND ADVANCED (GAUZE/BANDAGES/DRESSINGS) ×1
DERMABOND ADVANCED .7 DNX12 (GAUZE/BANDAGES/DRESSINGS) ×1 IMPLANT
DRSG OPSITE POSTOP 4X10 (GAUZE/BANDAGES/DRESSINGS) ×2 IMPLANT
ELECT CAUTERY BLADE 6.4 (BLADE) ×2 IMPLANT
ELECT REM PT RETURN 9FT ADLT (ELECTROSURGICAL) ×2
ELECTRODE REM PT RTRN 9FT ADLT (ELECTROSURGICAL) ×1 IMPLANT
EXTRT SYSTEM ALEXIS 14CM (MISCELLANEOUS) ×2
GLOVE SURG UNDER POLY LF SZ6.5 (GLOVE) ×4 IMPLANT
GOWN STRL REUS W/ TWL LRG LVL3 (GOWN DISPOSABLE) ×1 IMPLANT
GOWN STRL REUS W/ TWL XL LVL3 (GOWN DISPOSABLE) ×2 IMPLANT
GOWN STRL REUS W/TWL LRG LVL3 (GOWN DISPOSABLE) ×1
GOWN STRL REUS W/TWL XL LVL3 (GOWN DISPOSABLE) ×2
MANIFOLD NEPTUNE II (INSTRUMENTS) ×2 IMPLANT
MAT PREVALON FULL STRYKER (MISCELLANEOUS) ×2 IMPLANT
NS IRRIG 1000ML POUR BTL (IV SOLUTION) ×2 IMPLANT
PACK C SECTION AR (MISCELLANEOUS) ×2 IMPLANT
PAD OB MATERNITY 4.3X12.25 (PERSONAL CARE ITEMS) ×2 IMPLANT
PAD PREP 24X41 OB/GYN DISP (PERSONAL CARE ITEMS) ×2 IMPLANT
PENCIL SMOKE EVACUATOR (MISCELLANEOUS) ×2 IMPLANT
SUT MNCRL AB 4-0 PS2 18 (SUTURE) ×2 IMPLANT
SUT PLAIN 3-0 (SUTURE) ×2 IMPLANT
SUT VIC AB 0 CT1 36 (SUTURE) ×6 IMPLANT
SUT VIC AB 2-0 CT1 36 (SUTURE) ×2 IMPLANT
SYR 30ML LL (SYRINGE) ×4 IMPLANT

## 2021-04-23 NOTE — Op Note (Signed)
Cesarean Section Procedure Note 04/23/21  Pre-operative Diagnosis:  1. Fetal intolerance of labor  2. Preeclampsia with severe features Post-operative Diagnosis: same, delivered. Procedure: Primary Low Transverse Cesarean Section  Surgeon: Adelene Idler MD   Assistant(s): None Anesthesia: Spinal Estimated Blood Loss: 300 cc Complications: None; patient tolerated the procedure well.   Disposition: PACU - hemodynamically stable. Condition: stable   Findings: A female infant in the cephalic presentation. Amniotic fluid - clear   Birth weight: 3 lbs 11oz Apgars of 8 and 9.  Intact placenta with a three-vessel cord. Grossly normal uterus, tubes and ovaries bilaterally. No intraabdominal adhesions were noted.   Procedure Details    The patient was taken to operating room, identified as the correct patient and the procedure verified as C-Section Delivery. A time out was held and the above information confirmed. After induction of anesthesia, the patient was draped and prepped in the usual sterile manner. A Pfannenstiel incision was made and carried down through the subcutaneous tissue to the fascia. Fascial incision was made and extended transversely with the Mayo scissors. The fascia was separated from the underlying rectus tissue superiorly and inferiorly. The peritoneum was identified and entered bluntly. Peritoneal incision was extended longitudinally. An Alexis retractor was inserted.  A low transverse hysterotomy was made. The fetus was delivered atraumatically. The umbilical cord was clamped x2 and cut and the infant was handed to the awaiting pediatricians. The placenta was removed intact and appeared normal with a 3-vessel cord. The uterus was exteriorized and cleared of all clot and debris. The uterus was returned to the abdomen. The hysterotomy was closed with running sutures of 0 Vicryl suture. A second imbricating layer was placed with the same suture. Excellent hemostasis was  observed.   The Alexis retractor was removed. The peritoneum was closed with a running stitch of 2-0 Vicryl. The On Q Pain pump System was then placed.  Trocars were placed through the abdominal wall into the subfascial space and these were used to thread the silver soaker cathaters into place.The rectus muscles were inspected and were hemostatic. The rectus fascia was then reapproximated with running sutures of 0-vicryl, with careful placement not to incorporate the cathaters. Subcutaneous tissues are then irrigated with saline and hemostasis assured with the bovie. The subcutaneous fat was approximated with 3-0 plain and a running stitch.  The skin was closed with 4-0 monocryl suture in a subcuticular fashion followed by skin adhesive. The cathaters are flushed each with 5 mL of Bupivicaine and stabilized into place with dressing. Instrument, sponge, and needle counts were correct prior to the abdominal closure and at the conclusion of the case.   The patient tolerated the procedure well and was transferred to the recovery room in stable condition.   Natale Milch MD [facility], [facility] 04/23/21 4:41 AM

## 2021-04-23 NOTE — Anesthesia Preprocedure Evaluation (Signed)
Anesthesia Evaluation  Patient identified by MRN, date of birth, ID band Patient awake    Reviewed: Allergy & Precautions, NPO status , Patient's Chart, lab work & pertinent test results  Airway Mallampati: II  TM Distance: >3 FB     Dental   Pulmonary neg pulmonary ROS,    Pulmonary exam normal        Cardiovascular hypertension, Pt. on medications Normal cardiovascular exam     Neuro/Psych negative neurological ROS  negative psych ROS   GI/Hepatic negative GI ROS, Neg liver ROS,   Endo/Other  negative endocrine ROS  Renal/GU negative Renal ROS  negative genitourinary   Musculoskeletal negative musculoskeletal ROS (+)   Abdominal Normal abdominal exam  (+)   Peds negative pediatric ROS (+)  Hematology negative hematology ROS (+)   Anesthesia Other Findings   Reproductive/Obstetrics (+) Pregnancy                             Anesthesia Physical Anesthesia Plan  ASA: II and emergent  Anesthesia Plan: Spinal   Post-op Pain Management:    Induction: Intravenous  PONV Risk Score and Plan:   Airway Management Planned: Nasal Cannula  Additional Equipment:   Intra-op Plan:   Post-operative Plan:   Informed Consent:     Dental advisory given  Plan Discussed with: CRNA and Surgeon  Anesthesia Plan Comments:         Anesthesia Quick Evaluation

## 2021-04-23 NOTE — Anesthesia Procedure Notes (Signed)
Spinal  Patient location during procedure: OR Start time: 04/23/2021 3:25 AM End time: 04/23/2021 3:27 AM Reason for block: surgical anesthesia Staffing Performed: resident/CRNA  Anesthesiologist: Alvin Critchley, MD Resident/CRNA: Aline Brochure, CRNA Preanesthetic Checklist Completed: patient identified, IV checked, site marked, risks and benefits discussed, surgical consent, monitors and equipment checked, pre-op evaluation and timeout performed Spinal Block Patient position: sitting Prep: ChloraPrep Patient monitoring: heart rate, continuous pulse ox, blood pressure and cardiac monitor Approach: midline Location: L3-4 Injection technique: single-shot Needle Needle type: Whitacre and Introducer  Needle gauge: 24 G Needle length: 9 cm Assessment Sensory level: T10 Events: CSF return Additional Notes Negative paresthesia. Negative blood return. Positive free-flowing CSF. Expiration date of kit checked and confirmed. Patient tolerated procedure well, without complications.

## 2021-04-23 NOTE — Discharge Summary (Signed)
Postpartum Discharge Summary  Patient Name: Casey Garza DOB: [birth year] MRN: [id number]  Date of admission: 04/21/2021 Delivery date:04/23/2021  Delivering provider: Adrian Prows R  Date of discharge: 04/26/2021  Admitting diagnosis: Elevated blood pressure affecting pregnancy in third trimester, antepartum [O16.3] Severe preeclampsia, third trimester [O14.13] Intrauterine pregnancy: [redacted]w[redacted]d    Secondary diagnosis:  Active Problems:   Elevated blood pressure affecting pregnancy in third trimester, antepartum   Severe preeclampsia, third trimester   Intrauterine growth restriction affecting antepartum care of mother in third trimester   Delivery of pregnancy by cesarean section  Additional problems: None    Discharge diagnosis: Preterm Pregnancy Delivered                                              Post partum procedures: None Augmentation: Pitocin, Cytotec and IP Foley Complications: None  Hospital course: Induction of Labor With Cesarean Section   20y.o. yo G1P0000 at 343w1das admitted to the hospital 04/21/2021 for induction of labor due to diagnosis of preeclampsia with severe features. Patient had a labor course significant for fetal intolerance of labor. The patient went for cesarean section due to Non-Reassuring FHR. Delivery details are as follows: Membrane Rupture Time/Date: 3:49 AM ,04/23/2021   Delivery Method:C-Section, Low Transverse  Details of operation can be found in separate operative Note.  The patient's postpartum course was complicated by persistent hypertension and she was initiated on antihypertensive therapy. She is ambulating, tolerating a regular diet, passing flatus, and urinating well.  Patient is discharged home in stable condition on 04/26/21.      Newborn Data: Birth date:04/23/2021  Birth time:3:49 AM  Gender:Female  Living status:Living  Apgars:8 ,9  Weight:1670 g                                 Magnesium Sulfate received: Yes: Seizure  prophylaxis BMZ received: Yes Rhophylac:No MMR:No T-DaP:Given prenatally Flu: No Transfusion:No  Physical exam  Vitals:   04/25/21 1955 04/25/21 2320 04/26/21 0320 04/26/21 0852  BP: 138/86 (!) 141/96 133/85 (!) 142/100  Pulse: (!) 108 93 97 100  Resp: _0 Temp: 98.5 F (36.9 C) 98.4 F (36.9 C) 98.4 F (36.9 C) 98.7 F (37.1 C)  TempSrc: Oral Oral Oral Oral  SpO2: 98% 99% 99% 99%  Weight:      Height:       General: alert, cooperative and no distress Lochia: appropriate Uterine Fundus: firm Incision: Healing well with no significant drainage, Dressing is clean, dry, and intact DVT Evaluation: No evidence of DVT seen on physical exam. No significant calf/ankle edema. Labs: Lab Results  Component Value Date   WBC 15.3 (H) 04/24/2021   HGB 10.9 (L) 04/24/2021   HCT 31.6 (L) 04/24/2021   MCV 100.3 (H) 04/24/2021   PLT 226 04/24/2021   CMP Latest Ref Rng & Units 04/24/2021  Glucose 70 - 99 mg/dL 90  BUN 6 - 20 mg/dL 25(H)  Creatinine 0.44 - 1.00 mg/dL 1.18(H)  Sodium 135 - 145 mmol/L 135  Potassium 3.5 - 5.1 mmol/L 5.4(H)  Chloride 98 - 111 mmol/L 106  CO2 22 - 32 mmol/L 22  Calcium 8.9 - 10.3 mg/dL 7.6(L)  Total Protein 6.5 - 8.1 g/dL 5.5(L)  Total Bilirubin 0.3 - 1.2 mg/dL 0.5  Alkaline Phos 38 - 126 U/L 117  AST 15 - 41 U/L 22  ALT 0 - 44 U/L 14   Edinburgh Score: Edinburgh Postnatal Depression Scale Screening Tool 04/23/2021  I have been able to laugh and see the funny side of things. 0  I have looked forward with enjoyment to things. 0  I have blamed myself unnecessarily when things went wrong. 0  I have been anxious or worried for no good reason. 0  I have felt scared or panicky for no good reason. 0  Things have been getting on top of me. 0  I have been so unhappy that I have had difficulty sleeping. 0  I have felt sad or miserable. 0  I have been so unhappy that I have been crying. 0  The thought of harming myself has occurred to me. 0   Edinburgh Postnatal Depression Scale Total 0    After visit meds:  Allergies as of 04/26/2021      Reactions   Bactrim [sulfamethoxazole-trimethoprim] Rash   Rash, dizzy, nausea      Medication List    TAKE these medications   acetaminophen 500 MG tablet Commonly known as: TYLENOL Take 2 tablets (1,000 mg total) by mouth every 6 (six) hours as needed for fever or headache.   multivitamin-prenatal 27-0.8 MG Tabs tablet Take 1 tablet by mouth daily at 12 noon.   NIFEdipine 60 MG 24 hr tablet Commonly known as: ADALAT CC Take 1 tablet (60 mg total) by mouth daily.   oxyCODONE 5 MG immediate release tablet Commonly known as: Oxy IR/ROXICODONE Take 1 tablet (5 mg total) by mouth every 6 (six) hours as needed for moderate pain.       Discharge home in stable condition Infant Feeding: Bottle  Infant Disposition:NICU  Discharge instruction: per After Visit Summary and Postpartum booklet. Activity: Advance as tolerated. Pelvic rest for 6 weeks.  Diet: routine diet Anticipated Birth Control: None- patient decliend Postpartum Appointment:1 week Additional Postpartum F/U: Incision check 2-3 days and BP check 2-3 days Future Appointments: none  Follow up Visit:  Follow-up Information    Schuman, Stefanie Libel, MD. Schedule an appointment as soon as possible for a visit in 1 week.   Specialty: Obstetrics and Gynecology Why: For incision check Contact information: [place]. [place] [address] [address], [place], [place] [facility] 04/26/2021 9:16 AM

## 2021-04-23 NOTE — Telephone Encounter (Signed)
Pt in [facility] L&D.

## 2021-04-23 NOTE — Transfer of Care (Signed)
Immediate Anesthesia Transfer of Care Note  Patient: Casey Garza  Procedure(s) Performed: CESAREAN SECTION  Patient Location: LDR 3  Anesthesia Type:Spinal  Level of Consciousness: awake, alert  and oriented  Airway & Oxygen Therapy: Patient Spontanous Breathing  Post-op Assessment: Report given to RN and Post -op Vital signs reviewed and stable  Post vital signs: Reviewed and stable  Last Vitals:  Vitals Value Taken Time  BP 150/87   Temp    Pulse 90 04/23/21 0453  Resp 22 04/23/21 0453  SpO2 100 % 04/23/21 0453  Vitals shown include unvalidated device data.  Last Pain:  Vitals:   04/23/21 0035  TempSrc: Oral  PainSc:       Patients Stated Pain Goal: 2 (04/22/21 0036)  Complications: No complications documented.

## 2021-04-23 NOTE — Progress Notes (Addendum)
  Labor Progress Note   20 y.o. year old G1P0000 at [redacted]w[redacted]d weeks gestation admitted for Preeclampsia with severe features.   Subjective:  She is being induced. She has received two doses of cytotec. A foley bulb was placed and pitocin has been infusing. It was recently discontinued. She is feeling tired and having some hip discomfort. Second dose of betamethasone was just given. She has been on magnesium during her induction. Severe Range BP just treated with IV labetalol and pitocin discontinued for minimal variability. Contractions have been difficult to trace. IV fluids were reduced. Lasix was given this evening and urine output has increase.    Objective:   Vitals:   04/23/21 0007 04/23/21 0016  BP: (!) 168/92 (!) 144/76  Pulse: (!) 111 (!) 101  Resp:    Temp:    SpO2:      Abd: non-tender Extr: 3+ edema to thigh SVE: 4/70/-2  EFM: FHR: 130 bpm, variability: minima moderate,  Accelerations: absent decelerations: present, unable to associate with contractions Toco: Frequency: irregular  Assessment & Plan:  20 y.o. year old G1P0000 at [redacted]w[redacted]d weeks gestation admitted for severe preeclampsia  1. Pain management: options have been reviewed with the patient 2. FWB: FHT category 2 3. ID: GBS Positive- receiving PCN 4. Labor management: pitocin was discontinued.  5. Preeclampsia with severe features  Continue IV magnesium  Continue IV BP control  Receiving Procardia 30 mg  Repeat Labs now (CBC, CMP)  Check magnesium level  We discussed the possible need for cesarean delivery related to fetal intolerance and maternal status.   All discussed with patient, see orders  Adelene Idler MD [facility], [facility] 04/23/2021 12:19 AM

## 2021-04-23 NOTE — Progress Notes (Signed)
  Labor Progress Note   20 y.o. year old G1P0000 at [redacted]w[redacted]d weeks gestation admitted for severe preeclampsia   Subjective:  She reports feeling somewhat improved, but still exhausted.   Objective:   Vitals:   04/23/21 0230 04/23/21 0231  BP:  135/75  Pulse:  93  Resp: 15 14  Temp:    SpO2: 97%     Abd: non-tender Extr: 3+ edema SVE: 4-5/70/-2  EFM: FHR: 130 bpm, variability: moderate,  Accelerations:absent ,  decelerations: Absent Toco: Frequency: every 8-10 minutes   Assessment & Plan:  20 y.o. year old G1P0000 at [redacted]w[redacted]d weeks gestation admitted for preeclampsia with severe features. Pregnancy and Labor/Delivery Management  We discussed options of restarting induction of labor with ROM or pitocin versus cesarean section. [name] reports she is exhausted and feels unable to proceed with induction. She has been retaining significant fluid and mentation seemed affected close to midnight. For this reason will proceed with cesarean section  All discussed with patient, see orders  Adelene Idler MD [facility], [facility] 04/23/2021 2:46 AM

## 2021-04-23 NOTE — Progress Notes (Signed)
Obstetric Postpartum/PostOperative Daily Progress Note Subjective:  20 y.o. G1P0101 post-operative day # 0 status post primary cesarean delivery.  She is not ambulating, is tolerating po, is not voiding spontaneously. She remains on bedrest with foley catheter in place for accurate I&O measurements.  Her pain is well controlled on PO pain medications. Her lochia is less than menses. She currently reports no questions or concerns.   Medications SCHEDULED MEDICATIONS  . ferrous sulfate  325 mg Oral BID WC  . ibuprofen  600 mg Oral Q6H  . [START ON 04/24/2021] NIFEdipine  60 mg Oral Daily  . prenatal multivitamin  1 tablet Oral Q1200  . senna-docusate  2 tablet Oral Q24H  . simethicone  80 mg Oral TID PC    MEDICATION INFUSIONS  . bupivacaine 0.25 % ON-Q pump DUAL CATH 400 mL    . lactated ringers 8.3 mL/hr at 04/23/21 1500  . magnesium sulfate 1 g/hr (04/23/21 1500)  . oxytocin 2.5 Units/hr (04/23/21 1053)    PRN MEDICATIONS  bisacodyl, coconut oil, witch hazel-glycerin **AND** dibucaine, fentaNYL (SUBLIMAZE) injection, labetalol **AND** labetalol **AND** labetalol **AND** hydrALAZINE **AND** Measure blood pressure, ondansetron (ZOFRAN) IV, simethicone, sodium phosphate    Objective:   Vitals:   04/23/21 1400 04/23/21 1459 04/23/21 1600 04/23/21 1700  BP: 130/70 (!) 141/91 130/82   Pulse: 97 95 90   Resp: 16 17 18    Temp:    97.8 F (36.6 C)  TempSrc:    Oral  SpO2: 97%  96%   Weight:      Height:        Current Vital Signs 24h Vital Sign Ranges  T 97.8 F (36.6 C) Temp  Avg: 98 F (36.7 C)  Min: 97.6 F (36.4 C)  Max: 98.2 F (36.8 C)  BP 130/82 BP  Min: 126/111  Max: 168/92  HR 90 Pulse  Avg: 95  Min: 82  Max: 111  RR 18 Resp  Avg: 15.1  Min: 12  Max: 20  SaO2 96 %   SpO2  Avg: 97.4 %  Min: 95 %  Max: 100 %       24 Hour I/O Current Shift I/O  Time Ins Outs 05/26 0701 - 05/27 0700 In: 3846.7 [P.O.:978; I.V.:2412.9] Out: 3750 [Urine:3450] 05/27 0701 - 05/27  1900 In: 1524.1 [P.O.:750; I.V.:774.1] Out: 1490 [Urine:1490]  General: NAD Pulmonary: no increased work of breathing, CTAB Abdomen: non-distended, non-tender, fundus firm at level of umbilicus Inc: Clean/dry/intact Extremities: +2 edema bilateral LE to upper thigh, no erythema, no tenderness  Labs:  Recent Labs  Lab 04/23/21 0120 04/23/21 0827 04/23/21 1219  WBC 17.6* 21.7* 19.9*  HGB 12.5 12.8 12.7  HCT 37.6 37.2 37.3  PLT 227 234 241    Assessment:   20 y.o. G1P0101 postoperative day # 0 status post primary cesarean section, preeclampsia with severe features  Plan:  1) Acute blood loss anemia - hemodynamically stable and asymptomatic - po ferrous sulfate  2) Preeclampsia with severe features  -Continue Mg infusion for 24 hours postpartum  -Procardia XL - 60 mg total in last 24 hours, will start daily 60 mg dose 5/28   -PIH labs in AM   -Mg level as indicated - 5.8 mg/dL on last check  3) B POS Performed at [facility], [address] [place], [place]  / [address] 10.10 (12/27 0905)/ Varicella Immune  4) TDAP status UTD - 03/22/21  5) bottle /Contraception = none  6) Disposition - continue current  care - plan for discharge home when medically stable  Zipporah Plants, [place] 04/23/2021 5:35 PM

## 2021-04-24 LAB — CBC
HCT: 31.6 % — ABNORMAL LOW (ref 36.0–46.0)
Hemoglobin: 10.9 g/dL — ABNORMAL LOW (ref 12.0–15.0)
MCH: 34.6 pg — ABNORMAL HIGH (ref 26.0–34.0)
MCHC: 34.5 g/dL (ref 30.0–36.0)
MCV: 100.3 fL — ABNORMAL HIGH (ref 80.0–100.0)
Platelets: 226 10*3/uL (ref 150–400)
RBC: 3.15 MIL/uL — ABNORMAL LOW (ref 3.87–5.11)
RDW: 13.2 % (ref 11.5–15.5)
WBC: 15.3 10*3/uL — ABNORMAL HIGH (ref 4.0–10.5)
nRBC: 0 % (ref 0.0–0.2)

## 2021-04-24 LAB — COMPREHENSIVE METABOLIC PANEL
ALT: 14 U/L (ref 0–44)
AST: 22 U/L (ref 15–41)
Albumin: 2.5 g/dL — ABNORMAL LOW (ref 3.5–5.0)
Alkaline Phosphatase: 117 U/L (ref 38–126)
Anion gap: 7 (ref 5–15)
BUN: 25 mg/dL — ABNORMAL HIGH (ref 6–20)
CO2: 22 mmol/L (ref 22–32)
Calcium: 7.6 mg/dL — ABNORMAL LOW (ref 8.9–10.3)
Chloride: 106 mmol/L (ref 98–111)
Creatinine, Ser: 1.18 mg/dL — ABNORMAL HIGH (ref 0.44–1.00)
GFR, Estimated: >60 mL/min (ref >60)
Glucose, Bld: 90 mg/dL (ref 70–99)
Potassium: 5.4 mmol/L — ABNORMAL HIGH (ref 3.5–5.1)
Sodium: 135 mmol/L (ref 135–145)
Total Bilirubin: 0.5 mg/dL (ref 0.3–1.2)
Total Protein: 5.5 g/dL — ABNORMAL LOW (ref 6.5–8.1)

## 2021-04-24 MED ORDER — ZOLPIDEM TARTRATE 5 MG PO TABS: 5.0000 mg | ORAL_TABLET | Freq: Every evening | ORAL | Status: DC | PRN

## 2021-04-24 MED ORDER — DIPHENHYDRAMINE HCL 25 MG PO CAPS: 25.0000 mg | ORAL_CAPSULE | Freq: Four times a day (QID) | ORAL | Status: DC | PRN

## 2021-04-24 MED ORDER — HYDROMORPHONE HCL 1 MG/ML IJ SOLN: 0.2000 mg | INTRAMUSCULAR | Status: DC | PRN

## 2021-04-24 MED ORDER — OXYCODONE HCL 5 MG PO TABS
5.0000 mg | ORAL_TABLET | ORAL | Status: DC | PRN
  Administered 2021-04-25: 5 mg via ORAL
  Filled 2021-04-24: qty 1

## 2021-04-24 MED ORDER — ACETAMINOPHEN 500 MG PO TABS
1000.0000 mg | ORAL_TABLET | Freq: Four times a day (QID) | ORAL | Status: DC | PRN
  Administered 2021-04-24 – 2021-04-25 (×4): 1000 mg via ORAL
  Filled 2021-04-24 (×4): qty 2

## 2021-04-24 MED ORDER — NIFEDIPINE ER OSMOTIC RELEASE 30 MG PO TB24
30.0000 mg | ORAL_TABLET | Freq: Every day | ORAL | Status: DC
  Administered 2021-04-24 – 2021-04-25 (×2): 30 mg via ORAL
  Filled 2021-04-24 (×2): qty 1

## 2021-04-24 MED ORDER — MENTHOL 3 MG MT LOZG
1.0000 | LOZENGE | OROMUCOSAL | Status: DC | PRN
  Filled 2021-04-24: qty 9

## 2021-04-24 NOTE — Lactation Note (Signed)
This note was copied from a baby's chart. Lactation Consultation Note  Patient Name: Casey Garza Today's Date: 04/24/2021   Age:21 hours  Maternal Data    Feeding   Mom's original choice was to formula feed.  After health advantages discussed with mom, she agreed to pump.  The baby was over 24 hours by this time.  Symphony was set up in room with instructions in warmth, massage, when and how to pump, collection, storage, cleaning, labeling and handling of expressed milk.  Mom pumped and hand expressed 20 ml which was taken to the nursery.  For the next pumping, mom expressed a good amount but spilled it when taking the flanges off so only was able to salvage 5 ml which was taken to the nursery.  At the next pumping, mom was in the SCN visiting the baby.  She told the [facility] nurse that she and the FOB had talked about it and they decided not to breast feed or pump but to go back to her original feeding choice of formula.  She told her M/B nurse the same.  Lactation name and number was written on the white board and encouraged mom to call if she changed her mind. LATCH Score                    Lactation Tools Discussed/Used    Interventions    Discharge    Consult Status      Louis Meckel 04/24/2021, 8:06 PM

## 2021-04-24 NOTE — Anesthesia Postprocedure Evaluation (Signed)
Anesthesia Post Note  Patient: Casey Garza  Procedure(s) Performed: CESAREAN SECTION  Patient location during evaluation: Mother Baby Anesthesia Type: Spinal Level of consciousness: oriented and awake and alert Pain management: pain level controlled Vital Signs Assessment: post-procedure vital signs reviewed and stable Respiratory status: spontaneous breathing and respiratory function stable Cardiovascular status: blood pressure returned to baseline and stable Postop Assessment: no headache, no backache, no apparent nausea or vomiting and able to ambulate Anesthetic complications: no   No complications documented.   Last Vitals:  Vitals:   04/24/21 0848 04/24/21 1216  BP: 129/85 (!) 135/92  Pulse: 85 95  Resp: 17 18  Temp: 36.9 C 37.2 C  SpO2: 99%     Last Pain:  Vitals:   04/24/21 1216  TempSrc: Oral  PainSc:                  Donato Schultz

## 2021-04-24 NOTE — Progress Notes (Signed)
Subjective: Postpartum Day 1: Cesarean Delivery Patient reports tolerating PO, + flatus and no problems voiding.  She was unaware of the benefits of pumping and giving her baby colostrum, and is amenable to starting with lactation to pump today. She has a slight headache (rates 3 out of 10), but denies any visual disturbances, denies any chest pain. Her partner is present in a chair by the bed.  Objective: Vital signs in last 24 hours: Temp:  [97.8 F (36.6 C)-99.1 F (37.3 C)] 98.5 F (36.9 C) (05/28 0848) Pulse Rate:  [85-104] 85 (05/28 0848) Resp:  [13-18] 17 (05/28 0848) BP: (113-159)/(54-93) 129/85 (05/28 0848) SpO2:  [95 %-100 %] 99 % (05/28 0848)  Physical Exam:  General: alert, cooperative and no distress Lochia: appropriate Uterine Fundus: firm Incision: healing well, no significant drainage, honeycomb dressing in place. On q system also in use. DVT Evaluation: No evidence of DVT seen on physical exam. Negative Homan's sign. Continues with SCD hose.  Recent Labs    04/23/21 1219 04/24/21 0618  HGB 12.7 10.9*  HCT 37.3 31.6*    Assessment/Plan: Status post Cesarean section. Doing well postoperatively.  Continue current care Tylenol PRN order added to order set. Discussed patient's interest in providing colostrum to the baby with the lactation Consultant. Will supply her with a breastpump today.Mirna Mires 04/24/2021, 9:09 AM

## 2021-04-25 MED ORDER — NIFEDIPINE ER OSMOTIC RELEASE 30 MG PO TB24
30.0000 mg | ORAL_TABLET | Freq: Once | ORAL | Status: AC
  Administered 2021-04-25: 30 mg via ORAL
  Filled 2021-04-25: qty 1

## 2021-04-25 MED ORDER — NIFEDIPINE ER OSMOTIC RELEASE 30 MG PO TB24
60.0000 mg | ORAL_TABLET | Freq: Every day | ORAL | Status: DC
  Administered 2021-04-26: 60 mg via ORAL
  Filled 2021-04-25: qty 2

## 2021-04-25 NOTE — Progress Notes (Signed)
Notified CNM, Eunice Blase of elevated BP. Placed new order for one time dose of 30mg  of procardia. Patient also complaining of headache 8/10.  Denies blurred vision.  Lights are off in room and patient resting in bed. Educated patient to call out if she has new symptoms.  Will recheck BP Q15 min for 1 hour.

## 2021-04-25 NOTE — Progress Notes (Signed)
Subjective: Postpartum Day 2: Cesarean Delivery Patient reports tolerating PO, + flatus and no problems voiding.  Her pain level is zero this morning.  Her baby is doing well in the NICU. She did pump colostrum yesterday for the baby, but has now opted out of pumping.She would like to be discharged tomorrow.  Objective: Vital signs in last 24 hours: Temp:  [98.1 F (36.7 C)-98.9 F (37.2 C)] 98.2 F (36.8 C) (05/29 0816) Pulse Rate:  [75-98] 75 (05/29 0816) Resp:  [18] 18 (05/29 0816) BP: (135-152)/(76-99) 140/99 (05/29 0816) SpO2:  [97 %-100 %] 98 % (05/29 0816)  Physical Exam:  General: alert, cooperative, no distress and mildly obese Lochia: appropriate Uterine Fundus: firm Incision: healing well, no significant drainage, honeycomb dressing in place. DVT Evaluation: No evidence of DVT seen on physical exam. Negative Homan's sign. No cords or calf tenderness.  Recent Labs    04/23/21 1219 04/24/21 0618  HGB 12.7 10.9*  HCT 37.3 31.6*    Assessment/Plan: Status post Cesarean section. Doing well postoperatively.  Continue current care Anticipate discharge tomorrow.Mirna Mires 04/25/2021, 11:03 AM

## 2021-04-26 MED ORDER — ACETAMINOPHEN 500 MG PO TABS: 1000.0000 mg | ORAL_TABLET | Freq: Four times a day (QID) | ORAL | 0 refills | Status: DC | PRN

## 2021-04-26 MED ORDER — NIFEDIPINE ER 60 MG PO TB24: 60.0000 mg | ORAL_TABLET | Freq: Every day | ORAL | 0 refills | Status: DC

## 2021-04-26 MED ORDER — OXYCODONE HCL 5 MG PO TABS: 5.0000 mg | ORAL_TABLET | Freq: Four times a day (QID) | ORAL | 0 refills | Status: DC | PRN

## 2021-04-26 NOTE — Discharge Instructions (Signed)
Discharge Instructions:   Follow-up Appointment:   If there are any new medications, they have been ordered and will be available for pickup at the listed pharmacy on your way home from the hospital.   Call the office if you have any of the following: headache, visual changes, fever >101.0 F, chills, shortness of breath, breast concerns, excessive vaginal bleeding, incision drainage or problems, leg pain or redness, depression or any other concerns. If you have vaginal discharge with an odor, let your doctor know.   It is normal to bleed for up to 6 weeks. You should not soak through more than 1 pad in 1 hour. If you have a blood clot larger than your fist with continued bleeding, call your doctor.   After a c-section, you should expect a small amount of blood or clear fluid coming from the incision and abdominal cramping/soreness. Inspect your incision site daily. Stand in front of a mirror to look for any redness, incision opening, or discolored/odorness drainage. Take a shower daily and continue good hygiene. Use own towel and washcloth (do not share). Make sure your sheets on your bed are clean. No pets sleeping around your incision site. Dressing will be removed at your postpartum visit. If the dressing does become wet or soiled underneath, it is okay to remove it before your visit.    On-Q pump: You will remove on day 4 after insertion or if the ball becomes flat before day 4. You will remove on: 04/27/2021.  Activity: Do not lift > 15 lbs for 6 weeks (do not lift anything heavier than your baby). No intercourse, tampons, swimming pools, hot tubs, baths (only showers) for 6 weeks.  No driving for 1-2 weeks. Do not drive while taking narcotic or opioid pain medication.  Continue taking your prenatal vitamin, especially if breastfeeding. Increase calories and fluids (water) while breastfeeding.   Your milk will come in, in the next couple of days (right now it is colostrum). You may have a  slight fever when your milk comes in, but it should go away on its own.  If it does not, and rises above 101 F please call the doctor. You will also feel achy and your breasts will be firm. They will also start to leak. If you are breastfeeding, continue as you have been and you can pump/express milk for comfort.   If you have too much milk, your breasts can become engorged, which could lead to mastitis. This is an infection of the milk ducts. It can be very painful and you will need to notify your doctor to obtain a prescription for antibiotics. You can also treat it with a shower or hot/cold compress.   For concerns about your baby, please call your pediatrician.  For breastfeeding concerns, the lactation consultant can be reached at [phone].   Postpartum blues (feelings of happy one minute and sad another minute) are normal for the first few weeks but if it gets worse let your doctor know.   Congratulations! We enjoyed caring for you and your new bundle of joy!

## 2021-04-26 NOTE — Progress Notes (Signed)
Mother discharged.  Discharge instructions given.  Mother verbalizes understanding.  Will be transported after visit with baby in SCN.

## 2021-04-27 ENCOUNTER — Telehealth: Payer: Self-pay | Admitting: *Deleted

## 2021-04-27 ENCOUNTER — Encounter: Payer: Medicaid Other | Admitting: Obstetrics and Gynecology

## 2021-04-27 LAB — SURGICAL PATHOLOGY

## 2021-04-27 NOTE — Telephone Encounter (Addendum)
Transition Care Management Unsuccessful Follow-up Telephone Call  Date of discharge and from where:  04/26/2021 [facility]  Attempts:  1st Attempt  Reason for unsuccessful TCM follow-up call:  Left voice message

## 2021-04-28 NOTE — Telephone Encounter (Signed)
Transition Care Management Follow-up Telephone Call  Date of discharge and from where: 04/26/2021 [facility]  How have you been since you were released from the hospital? "Doing good"  Any questions or concerns? No  Items Reviewed:  Did the pt receive and understand the discharge instructions provided? Yes   Medications obtained and verified? Yes   Other? No   Any new allergies since your discharge? No   Dietary orders reviewed? No  Do you have support at home? Yes    Functional Questionnaire: (I = Independent and D = Dependent) ADLs: I  Bathing/Dressing- I  Meal Prep- I  Eating- I  Maintaining continence- I  Transferring/Ambulation- I  Managing Meds- I  Follow up appointments reviewed:   PCP Hospital f/u appt confirmed? No    Specialist Hospital f/u appt confirmed? Yes  Scheduled to see OBGYN on 04/30/2021 @ 0950.  Are transportation arrangements needed? No   If their condition worsens, is the pt aware to call PCP or go to the Emergency Dept.? Yes  Was the patient provided with contact information for the PCP's office or ED? Yes  Was to pt encouraged to call back with questions or concerns? Yes

## 2021-04-30 ENCOUNTER — Ambulatory Visit (INDEPENDENT_AMBULATORY_CARE_PROVIDER_SITE_OTHER): Payer: Medicaid Other | Admitting: Obstetrics and Gynecology

## 2021-04-30 ENCOUNTER — Other Ambulatory Visit: Payer: Self-pay

## 2021-04-30 ENCOUNTER — Encounter: Payer: Self-pay | Admitting: Obstetrics and Gynecology

## 2021-04-30 NOTE — Progress Notes (Signed)
Postpartum Visit  Chief Complaint:  Chief Complaint  Patient presents with  . Postpartum Care    History of Present Illness: Patient is a 20 y.o. G1P0101 presents for postpartum visit.  Date of delivery: 04/23/2021 Type of delivery: Cesarean section  Pregnancy or labor problems: Preeclampsia ans IUGR  Breast Feeding:  no Lochia: normal Post partum depression/anxiety noted:  no Date of last PAP: under 21 Any problems since the delivery:  no  Newborn Details:  SINGLETON :  1. Baby Gender:female.  Infant Status: Infant in NICU.   Review of Systems: ROS   Past Medical History:  Past Medical History:  Diagnosis Date  . Acne   . Vitamin D deficiency disease     Past Surgical History:  Past Surgical History:  Procedure Laterality Date  . CESAREAN SECTION  04/23/2021   Procedure: CESAREAN SECTION;  Surgeon: Natale Milch, MD;  Location: [facility] ORS;  Service: Obstetrics;;  . NO PAST SURGERIES      Family History:  Family History  Problem Relation Age of Onset  . Diabetes Maternal Grandmother   . Hypertension Maternal Grandmother   . Breast cancer Neg Hx   . Ovarian cancer Neg Hx   . Colon cancer Neg Hx     Social History:  Social History   Socioeconomic History  . Marital status: Single    Spouse name: Not on file  . Number of children: Not on file  . Years of education: 48  . Highest education level: High school graduate  Occupational History  . Occupation: [employer]  Tobacco Use  . Smoking status: Never Smoker  . Smokeless tobacco: Never Used  Vaping Use  . Vaping Use: Former  Substance and Sexual Activity  . Alcohol use: No  . Drug use: Not Currently  . Sexual activity: Yes    Birth control/protection: Patch  Other Topics Concern  . Not on file  Social History Narrative  . Not on file   Social Determinants of Health   Financial Resource Strain: Not on file  Food Insecurity: Not on file  Transportation Needs: Not on file   Physical Activity: Not on file  Stress: Not on file  Social Connections: Not on file  Intimate Partner Violence: Not on file    Allergies:  Allergies  Allergen Reactions  . Bactrim [Sulfamethoxazole-Trimethoprim] Rash    Rash, dizzy, nausea    Medications: Prior to Admission medications   Medication Sig Start Date End Date Taking? Authorizing Provider  acetaminophen (TYLENOL) 500 MG tablet Take 2 tablets (1,000 mg total) by mouth every 6 (six) hours as needed for fever or headache. 04/26/21   Zipporah Plants, CNM  NIFEdipine (ADALAT CC) 60 MG 24 hr tablet Take 1 tablet (60 mg total) by mouth daily. 04/26/21   Zipporah Plants, CNM  oxyCODONE (OXY IR/ROXICODONE) 5 MG immediate release tablet Take 1 tablet (5 mg total) by mouth every 6 (six) hours as needed for moderate pain. 04/26/21   Zipporah Plants, CNM  Prenatal Vit-Fe Fumarate-FA (MULTIVITAMIN-PRENATAL) 27-0.8 MG TABS tablet Take 1 tablet by mouth daily at 12 noon.    [provider]    Physical Exam Vitals:  Vitals:   04/30/21 0958  BP: 130/90    Physical Exam Constitutional:      Appearance: Normal appearance. She is well-developed.  HENT:     Head: Normocephalic and atraumatic.  Eyes:     Extraocular Movements: Extraocular movements intact.     Pupils: Pupils are equal, round,  and reactive to light.  Neck:     Thyroid: No thyromegaly.  Cardiovascular:     Rate and Rhythm: Normal rate and regular rhythm.     Heart sounds: Normal heart sounds.  Pulmonary:     Effort: Pulmonary effort is normal.     Breath sounds: Normal breath sounds.  Abdominal:     General: Bowel sounds are normal. There is no distension.     Palpations: Abdomen is soft. There is no mass.     Comments: Incision is clean dry and intact  Musculoskeletal:     Cervical back: Neck supple.  Neurological:     Mental Status: She is alert and oriented to person, place, and time.  Skin:    General: Skin is warm and dry.  Psychiatric:         Behavior: Behavior normal.        Thought Content: Thought content normal.        Judgment: Judgment normal.  Vitals reviewed.     Assessment: 20 y.o. G1P0101 presenting for 6 week postpartum visit  Plan: Problem List Items Addressed This Visit   None   Visit Diagnoses    Postpartum care and examination    -  Primary       1) Contraception-  None  2)  Pap: Start at 21  3) Patient underwent screening for postpartum depression with no concerns noted.  4) Continue Procardia Xl 60 mg- reacess at next visit. Can discontine 48-24 hours before next visit.   - Follow up in 2 weeks   Adelene Idler MD, [facility], [facility] 04/30/2021 10:38 AM

## 2021-05-14 ENCOUNTER — Ambulatory Visit (INDEPENDENT_AMBULATORY_CARE_PROVIDER_SITE_OTHER): Payer: Medicaid Other | Admitting: Obstetrics and Gynecology

## 2021-05-14 ENCOUNTER — Encounter: Payer: Self-pay | Admitting: Obstetrics and Gynecology

## 2021-05-14 ENCOUNTER — Other Ambulatory Visit: Payer: Self-pay

## 2021-05-14 DIAGNOSIS — O165 Unspecified maternal hypertension, complicating the puerperium: Secondary | ICD-10-CM

## 2021-05-14 MED ORDER — NIFEDIPINE ER OSMOTIC RELEASE 90 MG PO TB24
90.0000 mg | ORAL_TABLET | Freq: Every day | ORAL | 2 refills | Status: DC
Start: 2021-05-14 — End: 2021-06-08

## 2021-05-14 NOTE — Progress Notes (Signed)
Postpartum Visit  Chief Complaint:  Chief Complaint  Patient presents with   Gynecologic Exam    History of Present Illness: Patient is a 20 y.o. G1P0101 presents for postpartum visit.  Date of delivery: 04/23/2021 Type of delivery: Cesarean Section Laceration: no  Pregnancy or labor problems: Preeclampsia and IUGR  Breast Feeding:  no Lochia: less flow than a normal period Post partum depression/anxiety noted:  no Date of last PAP: under 21 Any problems since the delivery:  reports occassional headaches. Having elevated BP at home of systolic 150s with taking procardia 60mg  XL  Newborn Details:  SINGLETON :  1. Baby Gender:female.  Infant Status: Infant doing well at home with mother.   Review of Systems: ROS   Past Medical History:  Past Medical History:  Diagnosis Date   Acne    Vitamin D deficiency disease     Past Surgical History:  Past Surgical History:  Procedure Laterality Date   CESAREAN SECTION  04/23/2021   Procedure: CESAREAN SECTION;  Surgeon: 04/25/2021, MD;  Location: [facility] ORS;  Service: Obstetrics;;   NO PAST SURGERIES      Family History:  Family History  Problem Relation Age of Onset   Diabetes Maternal Grandmother    Hypertension Maternal Grandmother    Breast cancer Neg Hx    Ovarian cancer Neg Hx    Colon cancer Neg Hx     Social History:  Social History   Socioeconomic History   Marital status: Single    Spouse name: Not on file   Number of children: Not on file   Years of education: 12   Highest education level: High school graduate  Occupational History   Occupation: [employer]  Tobacco Use   Smoking status: Never   Smokeless tobacco: Never  Vaping Use   Vaping Use: Former  Substance and Sexual Activity   Alcohol use: No   Drug use: Not Currently   Sexual activity: Yes    Birth control/protection: Patch  Other Topics Concern   Not on file  Social History Narrative   Not on file   Social  Determinants of Health   Financial Resource Strain: Not on file  Food Insecurity: Not on file  Transportation Needs: Not on file  Physical Activity: Not on file  Stress: Not on file  Social Connections: Not on file  Intimate Partner Violence: Not on file    Allergies:  Allergies  Allergen Reactions   Bactrim [Sulfamethoxazole-Trimethoprim] Rash    Rash, dizzy, nausea    Medications: Prior to Admission medications   Medication Sig Start Date End Date Taking? Authorizing Provider  NIFEdipine (PROCARDIA XL/NIFEDICAL-XL) 90 MG 24 hr tablet Take 1 tablet (90 mg total) by mouth daily. 05/14/21  Yes Corrina Steffensen R, MD  oxyCODONE (OXY IR/ROXICODONE) 5 MG immediate release tablet Take 1 tablet (5 mg total) by mouth every 6 (six) hours as needed for moderate pain. 04/26/21  Yes Veal, 04/28/21, CNM  Prenatal Vit-Fe Fumarate-FA (MULTIVITAMIN-PRENATAL) 27-0.8 MG TABS tablet Take 1 tablet by mouth daily at 12 noon.   Yes [provider]  acetaminophen (TYLENOL) 500 MG tablet Take 2 tablets (1,000 mg total) by mouth every 6 (six) hours as needed for fever or headache. Patient not taking: Reported on 05/14/2021 04/26/21   04/28/21, CNM    Physical Exam Vitals:  Vitals:   05/14/21 1613  BP: 132/72    OBGyn Exam  Assessment: 19 y.o. G1P0101 presenting for 6 week postpartum visit  Plan: Problem List Items Addressed This Visit   None Visit Diagnoses     Postpartum hypertension    -  Primary   Relevant Medications   NIFEdipine (PROCARDIA XL/NIFEDICAL-XL) 90 MG 24 hr tablet        1) Contraception-  none desired  2)  Pap: ASCCP guidelines and rational discussed.  Start at 21  3) Patient underwent screening for postpartum depression with no concerns noted.  4) Increased Procardia to 90 mg XL.   - Follow up in 3 weeks  Adelene Idler MD, [facility], [facility] 05/17/2021 7:30 AM   Raised procardia to 90

## 2021-05-18 ENCOUNTER — Telehealth: Payer: Self-pay

## 2021-05-18 NOTE — Telephone Encounter (Signed)
FMLA/DISABILITY forms for [employer] filled out; will obtain signature, fax, and process.

## 2021-06-03 ENCOUNTER — Inpatient Hospital Stay: Admit: 2021-06-03 | Payer: Self-pay

## 2021-06-08 ENCOUNTER — Ambulatory Visit (INDEPENDENT_AMBULATORY_CARE_PROVIDER_SITE_OTHER): Payer: Medicaid Other | Admitting: Obstetrics and Gynecology

## 2021-06-08 ENCOUNTER — Other Ambulatory Visit: Payer: Self-pay

## 2021-06-08 ENCOUNTER — Encounter: Payer: Self-pay | Admitting: Obstetrics and Gynecology

## 2021-06-08 ENCOUNTER — Telehealth: Payer: Self-pay

## 2021-06-08 DIAGNOSIS — R875 Abnormal microbiological findings in specimens from female genital organs: Secondary | ICD-10-CM | POA: Diagnosis not present

## 2021-06-08 DIAGNOSIS — R300 Dysuria: Secondary | ICD-10-CM | POA: Diagnosis not present

## 2021-06-08 DIAGNOSIS — O165 Unspecified maternal hypertension, complicating the puerperium: Secondary | ICD-10-CM

## 2021-06-08 DIAGNOSIS — Z3009 Encounter for other general counseling and advice on contraception: Secondary | ICD-10-CM

## 2021-06-08 LAB — POCT URINALYSIS DIPSTICK
Bilirubin, UA: NEGATIVE
Blood, UA: POSITIVE
Glucose, UA: NEGATIVE
Nitrite, UA: NEGATIVE
Protein, UA: POSITIVE — AB
Spec Grav, UA: >=1.03 — AB (ref 1.010–1.025)
Urobilinogen, UA: 0.2 E.U./dL
pH, UA: 5 (ref 5.0–8.0)

## 2021-06-08 MED ORDER — NITROFURANTOIN MONOHYD MACRO 100 MG PO CAPS: 100.0000 mg | ORAL_CAPSULE | Freq: Two times a day (BID) | ORAL | 1 refills | Status: DC

## 2021-06-08 MED ORDER — PHEXXI 1.8-1-0.4 % VA GEL: 1.0000 | VAGINAL | 11 refills | Status: DC | PRN

## 2021-06-08 MED ORDER — NIFEDIPINE ER OSMOTIC RELEASE 90 MG PO TB24: 90.0000 mg | ORAL_TABLET | Freq: Every day | ORAL | 2 refills | Status: DC

## 2021-06-08 NOTE — Patient Instructions (Signed)

## 2021-06-08 NOTE — Progress Notes (Signed)
Postpartum Visit  Chief Complaint:  Chief Complaint  Patient presents with   Routine Prenatal Visit    History of Present Illness: Patient is a 20 y.o. G1P0101 presents for postpartum visit.  Date of delivery: 04/23/2021 Type of delivery: Cesarean Section Laceration: no  Pregnancy or labor problems: Preeclampsia and IUGR  Breast Feeding:  no Lochia: none Post partum depression/anxiety noted:  yes Edinburgh Post-Partum Depression Score:   Date of last PAP: under 21  Any problems since the delivery:  improved BP, no low BP, continued occassional headaches.   Newborn Details:  SINGLETON :  1. Baby Gender:female.  Infant Status: Infant doing well at home with mother.   Review of Systems: Review of Systems  Constitutional:  Negative for chills, fever, malaise/fatigue and weight loss.  HENT:  Negative for congestion, hearing loss and sinus pain.   Eyes:  Negative for blurred vision and double vision.  Respiratory:  Negative for cough, sputum production, shortness of breath and wheezing.   Cardiovascular:  Negative for chest pain, palpitations, orthopnea and leg swelling.  Gastrointestinal:  Negative for abdominal pain, constipation, diarrhea, nausea and vomiting.  Genitourinary:  Negative for dysuria, flank pain, frequency, hematuria and urgency.  Musculoskeletal:  Negative for back pain, falls and joint pain.  Skin:  Negative for itching and rash.  Neurological:  Negative for dizziness and headaches.  Psychiatric/Behavioral:  Negative for depression, substance abuse and suicidal ideas. The patient is not nervous/anxious.     Past Medical History:  Past Medical History:  Diagnosis Date   Acne    Vitamin D deficiency disease     Past Surgical History:  Past Surgical History:  Procedure Laterality Date   CESAREAN SECTION  04/23/2021   Procedure: CESAREAN SECTION;  Surgeon: Natale Milch, MD;  Location: [facility] ORS;  Service: Obstetrics;;   NO PAST SURGERIES       Family History:  Family History  Problem Relation Age of Onset   Diabetes Maternal Grandmother    Hypertension Maternal Grandmother    Breast cancer Neg Hx    Ovarian cancer Neg Hx    Colon cancer Neg Hx     Social History:  Social History   Socioeconomic History   Marital status: Single    Spouse name: Not on file   Number of children: Not on file   Years of education: 12   Highest education level: High school graduate  Occupational History   Occupation: [employer]  Tobacco Use   Smoking status: Never   Smokeless tobacco: Never  Vaping Use   Vaping Use: Former  Substance and Sexual Activity   Alcohol use: No   Drug use: Not Currently   Sexual activity: Yes    Birth control/protection: Patch  Other Topics Concern   Not on file  Social History Narrative   Not on file   Social Determinants of Health   Financial Resource Strain: Not on file  Food Insecurity: Not on file  Transportation Needs: Not on file  Physical Activity: Not on file  Stress: Not on file  Social Connections: Not on file  Intimate Partner Violence: Not on file    Allergies:  Allergies  Allergen Reactions   Bactrim [Sulfamethoxazole-Trimethoprim] Rash    Rash, dizzy, nausea    Medications: Prior to Admission medications   Medication Sig Start Date End Date Taking? Authorizing Provider  Lactic Ac-Citric Ac-Pot Bitart (PHEXXI) 1.8-1-0.4 % GEL Place 1 each vaginally as needed (Up to 1 hour before intercourse). 06/08/21  Yes Gwendalynn Eckstrom R, MD  NIFEdipine (PROCARDIA XL/NIFEDICAL-XL) 90 MG 24 hr tablet Take 1 tablet (90 mg total) by mouth daily. 05/14/21  Yes Shyvonne Chastang R, MD  Prenatal Vit-Fe Fumarate-FA (MULTIVITAMIN-PRENATAL) 27-0.8 MG TABS tablet Take 1 tablet by mouth daily at 12 noon.   Yes [provider]  acetaminophen (TYLENOL) 500 MG tablet Take 2 tablets (1,000 mg total) by mouth every 6 (six) hours as needed for fever or headache. Patient not taking:  Reported on 05/14/2021 04/26/21   Zipporah Plants, CNM  oxyCODONE (OXY IR/ROXICODONE) 5 MG immediate release tablet Take 1 tablet (5 mg total) by mouth every 6 (six) hours as needed for moderate pain. Patient not taking: Reported on 06/08/2021 04/26/21   Zipporah Plants, CNM    Physical Exam Vitals:  Vitals:   06/08/21 1549  BP: 118/70    Physical Exam Constitutional:      Appearance: Normal appearance. She is well-developed.  HENT:     Head: Normocephalic and atraumatic.  Eyes:     Extraocular Movements: Extraocular movements intact.     Pupils: Pupils are equal, round, and reactive to light.  Neck:     Thyroid: No thyromegaly.  Cardiovascular:     Rate and Rhythm: Normal rate and regular rhythm.     Heart sounds: Normal heart sounds.  Pulmonary:     Effort: Pulmonary effort is normal.     Breath sounds: Normal breath sounds.  Abdominal:     General: Bowel sounds are normal. There is no distension.     Palpations: Abdomen is soft. There is no mass.     Comments: Incision is well healed, clean dry and intact  Musculoskeletal:     Cervical back: Neck supple.  Neurological:     Mental Status: She is alert and oriented to person, place, and time.  Skin:    General: Skin is warm and dry.  Psychiatric:        Behavior: Behavior normal.        Thought Content: Thought content normal.        Judgment: Judgment normal.  Vitals reviewed.    Assessment: 20 y.o. G1P0101 presenting for 6 week postpartum visit  Plan: Problem List Items Addressed This Visit       Other   Delivery of pregnancy by cesarean section   Other Visit Diagnoses     Postpartum care and examination    -  Primary   Birth control counseling       Relevant Medications   Lactic Ac-Citric Ac-Pot Bitart (PHEXXI) 1.8-1-0.4 % GEL   Postpartum hypertension       Relevant Medications   NIFEdipine (PROCARDIA XL/NIFEDICAL-XL) 90 MG 24 hr tablet   Dysuria       Relevant Medications   nitrofurantoin,  macrocrystal-monohydrate, (MACROBID) 100 MG capsule   Other Relevant Orders   POCT Urinalysis Dipstick (Completed)   Urine Culture        1) Contraception-  reviewed options- She would like to avoid hormonal contraception. No interested in copper IUD currently.  Rx for phexxi sent.  2)  Pap: ASCCP guidelines and rational discussed.Start at 21  3) Patient underwent screening for postpartum depression with no concerns noted.  4) Continue Procardia, taper down if having low BP at home.  5) Dysuria and right sided pain, vaginal odor- nuswab collected. Macrobid rx sent   - Follow up in 2 months for pap smear.   Adelene Idler MD, [facility], [facility]  06/08/2021 4:34 PM

## 2021-06-08 NOTE — Telephone Encounter (Signed)
Records from 08/28/20 to 11/27/20 faxed to [facility].

## 2021-06-11 ENCOUNTER — Other Ambulatory Visit: Payer: Self-pay | Admitting: Obstetrics and Gynecology

## 2021-06-11 DIAGNOSIS — N3001 Acute cystitis with hematuria: Secondary | ICD-10-CM

## 2021-06-11 LAB — URINE CULTURE

## 2021-06-11 MED ORDER — CIPROFLOXACIN HCL 500 MG PO TABS: 500.0000 mg | ORAL_TABLET | Freq: Two times a day (BID) | ORAL | 0 refills | Status: DC

## 2021-06-11 NOTE — Progress Notes (Signed)
New Rx for UITI sent

## 2021-06-13 LAB — NUSWAB VAGINITIS PLUS (VG+)
Atopobium vaginae: HIGH Score — AB
Candida albicans, NAA: NEGATIVE
Candida glabrata, NAA: NEGATIVE
Chlamydia trachomatis, NAA: NEGATIVE
Neisseria gonorrhoeae, NAA: NEGATIVE
Trich vag by NAA: NEGATIVE

## 2021-07-05 ENCOUNTER — Telehealth: Payer: Self-pay

## 2021-07-05 NOTE — Telephone Encounter (Signed)
Patient reports Dr. Jerene Pitch treated her for a bacterial infection last month. She believes it has come back. Requesting call back. KJ#[phone]

## 2021-07-06 ENCOUNTER — Other Ambulatory Visit: Payer: Self-pay | Admitting: Obstetrics & Gynecology

## 2021-07-06 MED ORDER — METRONIDAZOLE 0.75 % VA GEL: 1.0000 | Freq: Every day | VAGINAL | 0 refills | Status: AC

## 2021-07-06 NOTE — Telephone Encounter (Signed)
Spoke w/patient. She is having the white discharge w/fishy odor again. No itching or burning at this time. Pharmacy on file is accurate. Advised Dr. Jerene Pitch is out of the office this week. I will send to provider on call.

## 2021-07-06 NOTE — Telephone Encounter (Signed)
Patient aware.

## 2021-07-06 NOTE — Telephone Encounter (Signed)
Have her try the Metrogel vag therapy for 5 nights.  If no better, then schedule appt for a check/

## 2021-07-29 ENCOUNTER — Other Ambulatory Visit: Payer: Self-pay

## 2021-07-29 ENCOUNTER — Ambulatory Visit: Payer: Medicaid Other | Admitting: Obstetrics and Gynecology

## 2021-07-29 ENCOUNTER — Encounter: Payer: Self-pay | Admitting: Obstetrics and Gynecology

## 2021-07-29 VITALS — BP 128/72 | Ht 68.0 in | Wt 208.2 lb

## 2021-07-29 DIAGNOSIS — B373 Candidiasis of vulva and vagina: Secondary | ICD-10-CM | POA: Diagnosis not present

## 2021-07-29 DIAGNOSIS — N939 Abnormal uterine and vaginal bleeding, unspecified: Secondary | ICD-10-CM

## 2021-07-29 DIAGNOSIS — Z113 Encounter for screening for infections with a predominantly sexual mode of transmission: Secondary | ICD-10-CM

## 2021-07-29 DIAGNOSIS — B3731 Acute candidiasis of vulva and vagina: Secondary | ICD-10-CM

## 2021-07-29 DIAGNOSIS — R875 Abnormal microbiological findings in specimens from female genital organs: Secondary | ICD-10-CM | POA: Diagnosis not present

## 2021-07-29 LAB — POCT URINE PREGNANCY: Preg Test, Ur: NEGATIVE

## 2021-07-29 MED ORDER — FLUCONAZOLE 150 MG PO TABS: 150.0000 mg | ORAL_TABLET | ORAL | 0 refills | Status: AC

## 2021-07-29 NOTE — Patient Instructions (Signed)
General vulvovaginal hygiene measures Keep vulva clean, dry, and well aerated  Avoid sleeper pajamas. Nightgowns allow air to circulate.  Cotton underpants. Double-rinse underwear after washing to avoid residual irritants. Do not use fabric softeners for underwear and swimsuits.  Avoid tights, leotards, and leggings. Skirts and loose-fitting pants allow air to circulate.  Avoid sitting in wet swimsuits for long periods of time.  Daily warm bathing   Do not use bubble baths or perfumed soaps.  soak in clean water (no soap) for 10 to 15 minutes.  Use soap to wash regions other than the genital area just before getting out of the tub. Limit use of any soap on genital areas.  Rinse the genital area well and gently pat dry.  A hair dryer on the cool setting may be helpful to assist with drying the genital region.  If the vulvar area is tender or swollen, cool compresses may relieve the discomfort. Emollients may help protect skin.  Review toilet hygiene    Sit with knees apart to reduce reflux of urine into the vagina.    Emphasize wiping front to back after bowel movements.  Wet wipes can be used instead of toilet paper for wiping as long as they don't cause a "stinging" sensation.   Vaginal Yeast Infection, Adult Vaginal yeast infection is a condition that causes vaginal discharge as well as soreness, swelling, and redness (inflammation) of the vagina. This is a common condition. Some women get this infection frequently. What are the causes? This condition is caused by a change in the normal balance of the yeast (Candida) and normal bacteria that live in the vagina. This change causes an overgrowth of yeast, which causes the inflammation. What increases the risk? The condition is more likely to develop in women who: Take antibiotic medicines. Have diabetes. Take birth control pills. Are pregnant. Douche often. Have a weak body defense system (immune system). Have been taking steroid  medicines for a long time. Frequently wear tight clothing. What are the signs or symptoms? Symptoms of this condition include: White, thick, creamy vaginal discharge. Swelling, itching, redness, and irritation of the vagina. The lips of the vagina (labia) may be affected as well. Pain or a burning feeling while urinating. Pain during sex. How is this diagnosed? This condition is diagnosed based on: Your medical history. A physical exam. A pelvic exam. Your health care provider will examine a sample of your vaginal discharge under a microscope. Your health care provider may send this sample for testing to confirm the diagnosis. How is this treated? This condition is treated with medicine. Medicines may be over-the-counter or prescription. You may be told to use one or more of the following: Medicine that is taken by mouth (orally). Medicine that is applied as a cream (topically). Medicine that is inserted directly into the vagina (suppository). Follow these instructions at home: Take or apply over-the-counter and prescription medicines only as told by your health care provider. Do not use tampons until your health care provider approves. Do not have sex until your infection has cleared. Sex can prolong or worsen your symptoms of infection. Ask your health care provider when it is safe to resume sexual activity. Keep all follow-up visits. This is important. How is this prevented?  Do not wear tight clothes, such as pantyhose or tight pants. Wear breathable cotton underwear. Do not use douches, perfumed soap, creams, or powders. Wipe from front to back after using the toilet. If you have diabetes, keep your blood sugar  levels under control. Ask your health care provider for other ways to prevent yeast infections. Contact a health care provider if: You have a fever. Your symptoms go away and then return. Your symptoms do not get better with treatment. Your symptoms get worse. You have  new symptoms. You develop blisters in or around your vagina. You have blood coming from your vagina and it is not your menstrual period. You develop pain in your abdomen. Summary Vaginal yeast infection is a condition that causes discharge as well as soreness, swelling, and redness (inflammation) of the vagina. This condition is treated with medicine. Medicines may be over-the-counter or prescription. Take or apply over-the-counter and prescription medicines only as told by your health care provider. Do not douche. Resume sexual activity or use of tampons as instructed by your health care provider. Contact a health care provider if your symptoms do not get better with treatment or your symptoms go away and then return. This information is not intended to replace advice given to you by your health care provider. Make sure you discuss any questions you have with your health care provider. Document Revised: 02/01/2021 Document Reviewed: 02/01/2021 Elsevier Patient Education  2022 [employer].

## 2021-07-29 NOTE — Progress Notes (Signed)
Patient ID: Casey Garza, female   DOB: 30-Apr-2001, 20 y.o.   MRN: [id number]  Reason for Consult: Follow-up   Referred by Danelle Berry, PA-C  Subjective:     HPI:  Casey Garza is a 20 y.o. female she presents today for follow-up regarding abnormal uterine bleeding.  She reports that since her delivery in May she has menstrual cycle bleeding every 2 weeks.   She has not been using any hormonal birth control.  She tried phexxi twice but it is not a convenient medication for her.  Her pregnancy was complicated by preeclampsia.  She is continue to have hypertension postpartum.  She checks her blood pressures regularly at home.  She has continued to be on Procardia XL 90 mg daily since her delivery.   Gynecological History  Patient's last menstrual period was 07/12/2021.  Obstetrical History OB History  Gravida Para Term Preterm AB Living  1 1 0 1 0 1  SAB IAB Ectopic Multiple Live Births  0 0 0 0 1    # Outcome Date GA Lbr Len/2nd Weight Sex Delivery Anes PTL Lv  1 Preterm 04/23/21 [redacted]w[redacted]d  3 lb 10.9 oz (1.67 kg) F CS-LTranv Spinal  LIV     Past Medical History:  Diagnosis Date   Acne    Vitamin D deficiency disease    Family History  Problem Relation Age of Onset   Diabetes Maternal Grandmother    Hypertension Maternal Grandmother    Breast cancer Neg Hx    Ovarian cancer Neg Hx    Colon cancer Neg Hx    Past Surgical History:  Procedure Laterality Date   CESAREAN SECTION  04/23/2021   Procedure: CESAREAN SECTION;  Surgeon: Natale Milch, MD;  Location: [facility] ORS;  Service: Obstetrics;;   NO PAST SURGERIES      Short Social History:  Social History   Tobacco Use   Smoking status: Never   Smokeless tobacco: Never  Substance Use Topics   Alcohol use: No    Allergies  Allergen Reactions   Bactrim [Sulfamethoxazole-Trimethoprim] Rash    Rash, dizzy, nausea    Current Outpatient Medications  Medication Sig Dispense Refill   ciprofloxacin  (CIPRO) 500 MG tablet Take 1 tablet (500 mg total) by mouth 2 (two) times daily. 6 tablet 0   Lactic Ac-Citric Ac-Pot Bitart (PHEXXI) 1.8-1-0.4 % GEL Place 1 each vaginally as needed (Up to 1 hour before intercourse). 60 g 11   NIFEdipine (PROCARDIA XL/NIFEDICAL-XL) 90 MG 24 hr tablet Take 1 tablet (90 mg total) by mouth daily. 30 tablet 2   acetaminophen (TYLENOL) 500 MG tablet Take 2 tablets (1,000 mg total) by mouth every 6 (six) hours as needed for fever or headache. (Patient not taking: Reported on 05/14/2021) 30 tablet 0   nitrofurantoin, macrocrystal-monohydrate, (MACROBID) 100 MG capsule Take 1 capsule (100 mg total) by mouth 2 (two) times daily. (Patient not taking: Reported on 07/29/2021) 14 capsule 1   Prenatal Vit-Fe Fumarate-FA (MULTIVITAMIN-PRENATAL) 27-0.8 MG TABS tablet Take 1 tablet by mouth daily at 12 noon. (Patient not taking: Reported on 07/29/2021)     No current facility-administered medications for this visit.    Review of Systems  Constitutional: Negative for chills, fatigue, fever and unexpected weight change.  HENT: Negative for trouble swallowing.  Eyes: Negative for loss of vision.  Respiratory: Negative for cough, shortness of breath and wheezing.  Cardiovascular: Negative for chest pain, leg swelling, palpitations and syncope.  GI: Negative for abdominal  pain, blood in stool, diarrhea, nausea and vomiting.  GU: Negative for difficulty urinating, dysuria, frequency and hematuria.  Musculoskeletal: Negative for back pain, leg pain and joint pain.  Skin: Negative for rash.  Neurological: Negative for dizziness, headaches, light-headedness, numbness and seizures.  Psychiatric: Negative for behavioral problem, confusion, depressed mood and sleep disturbance.       Objective:  Objective   Vitals:   07/29/21 1456  BP: 128/72  Weight: 208 lb 3.2 oz (94.4 kg)  Height: 5\' 8"  (1.727 m)   Body mass index is 31.66 kg/m.  Physical Exam Vitals and nursing note  reviewed. Exam conducted with a chaperone present.  Constitutional:      Appearance: Normal appearance. She is well-developed.  HENT:     Head: Normocephalic and atraumatic.  Eyes:     Extraocular Movements: Extraocular movements intact.     Pupils: Pupils are equal, round, and reactive to light.  Cardiovascular:     Rate and Rhythm: Normal rate and regular rhythm.  Pulmonary:     Effort: Pulmonary effort is normal. No respiratory distress.     Breath sounds: Normal breath sounds.  Abdominal:     General: Abdomen is flat.     Palpations: Abdomen is soft.  Genitourinary:    Comments: External: Normal appearing vulva. No lesions noted.  Speculum examination: Normal appearing cervix. No blood in the vaginal vault. Thick white discharge.  Bimanual examination: Uterus midline, non-tender, normal in size, shape and contour.  No CMT. No adnexal masses. No adnexal tenderness. Pelvis not fixed.  Musculoskeletal:        General: No signs of injury.  Skin:    General: Skin is warm and dry.  Neurological:     Mental Status: She is alert and oriented to person, place, and time.  Psychiatric:        Behavior: Behavior normal.        Thought Content: Thought content normal.        Judgment: Judgment normal.    Assessment/Plan:    20 year old with abnormal vaginal bleeding in the postpartum period Pregnancy test today was negative. We will check CBC and thyroid labs today. Pelvic ultrasound ordered.  Discharge suggestive of yeast. Ordered diflucan. Nuswab today. Patient reports problems with recurrent BV and yeast infections since delivery.   Discussed options for birth control usage in light of bleeding and CHTN, recommended avoiding estrogen use if possible and using POP or Mirena IUD. Provided her with information to consider.   More than 20 minutes were spent face to face with the patient in the room, reviewing the medical record, labs and images, and coordinating care for the patient.  The plan of management was discussed in detail and counseling was provided.    36 MD [facility], [facility] 07/29/2021 3:05 PM

## 2021-07-30 LAB — T3, FREE: T3, Free: 3.3 pg/mL (ref 2.0–4.4)

## 2021-07-30 LAB — CBC
Hematocrit: 39.7 % (ref 34.0–46.6)
Hemoglobin: 13.1 g/dL (ref 11.1–15.9)
MCH: 31.3 pg (ref 26.6–33.0)
MCHC: 33 g/dL (ref 31.5–35.7)
MCV: 95 fL (ref 79–97)
Platelets: 290 10*3/uL (ref 150–450)
RBC: 4.18 x10E6/uL (ref 3.77–5.28)
RDW: 12.6 % (ref 11.7–15.4)
WBC: 5.6 10*3/uL (ref 3.4–10.8)

## 2021-07-30 LAB — TSH+FREE T4
Free T4: 1.02 ng/dL (ref 0.82–1.77)
TSH: 0.724 u[IU]/mL (ref 0.450–4.500)

## 2021-08-02 LAB — NUSWAB VAGINITIS PLUS (VG+)
Candida albicans, NAA: NEGATIVE
Candida glabrata, NAA: NEGATIVE
Chlamydia trachomatis, NAA: NEGATIVE
Neisseria gonorrhoeae, NAA: NEGATIVE
Trich vag by NAA: NEGATIVE

## 2021-08-11 ENCOUNTER — Other Ambulatory Visit: Payer: Self-pay

## 2021-08-11 ENCOUNTER — Ambulatory Visit
Admission: RE | Admit: 2021-08-11 | Discharge: 2021-08-11 | Disposition: A | Discharge status: Home / Self Care | Payer: Medicaid Other | Source: Ambulatory Visit | Attending: Obstetrics and Gynecology | Admitting: Obstetrics and Gynecology

## 2021-08-11 DIAGNOSIS — N939 Abnormal uterine and vaginal bleeding, unspecified: Secondary | ICD-10-CM | POA: Insufficient documentation

## 2021-08-13 ENCOUNTER — Other Ambulatory Visit: Payer: Self-pay

## 2021-08-13 ENCOUNTER — Ambulatory Visit (INDEPENDENT_AMBULATORY_CARE_PROVIDER_SITE_OTHER): Payer: Medicaid Other | Admitting: Obstetrics and Gynecology

## 2021-08-13 ENCOUNTER — Encounter: Payer: Self-pay | Admitting: Obstetrics and Gynecology

## 2021-08-13 VITALS — BP 114/70 | Ht 68.0 in | Wt 210.0 lb

## 2021-08-13 DIAGNOSIS — N939 Abnormal uterine and vaginal bleeding, unspecified: Secondary | ICD-10-CM | POA: Diagnosis not present

## 2021-08-13 MED ORDER — NORETHINDRONE ACET-ETHINYL EST 1.5-30 MG-MCG PO TABS: 1.0000 | ORAL_TABLET | Freq: Every day | ORAL | 11 refills | Status: DC

## 2021-08-13 NOTE — Progress Notes (Signed)
Patient ID: Casey Garza, female   DOB: 26-May-2001, 20 y.o.   MRN: [id number]  Reason for Consult: Results   Referred by Danelle Berry, PA-C  Subjective:     HPI:  Casey Garza is a 20 y.o. female she presents today to follow-up regarding irregular bleeding.  She has been having postpartum.  She had a pelvic ultrasound yesterday which showed a thin endometrium measuring 4 mm.  She reports that she has continued to have nearly daily bleeding.  Her laboratory evaluation at her prior visit was normal.  Pregnancy test was negative on 07/29/2021.  Gynecological History  Patient's last menstrual period was 08/11/2021.  Past Medical History:  Diagnosis Date   Acne    Vitamin D deficiency disease    Family History  Problem Relation Age of Onset   Diabetes Maternal Grandmother    Hypertension Maternal Grandmother    Breast cancer Neg Hx    Ovarian cancer Neg Hx    Colon cancer Neg Hx    Past Surgical History:  Procedure Laterality Date   CESAREAN SECTION  04/23/2021   Procedure: CESAREAN SECTION;  Surgeon: Natale Milch, MD;  Location: [facility] ORS;  Service: Obstetrics;;   NO PAST SURGERIES      Short Social History:  Social History   Tobacco Use   Smoking status: Never   Smokeless tobacco: Never  Substance Use Topics   Alcohol use: No    Allergies  Allergen Reactions   Bactrim [Sulfamethoxazole-Trimethoprim] Rash    Rash, dizzy, nausea    Current Outpatient Medications  Medication Sig Dispense Refill   ciprofloxacin (CIPRO) 500 MG tablet Take 1 tablet (500 mg total) by mouth 2 (two) times daily. 6 tablet 0   fluconazole (DIFLUCAN) 150 MG tablet Take 1 tablet (150 mg total) by mouth once a week for 5 doses. 5 tablet 0   Lactic Ac-Citric Ac-Pot Bitart (PHEXXI) 1.8-1-0.4 % GEL Place 1 each vaginally as needed (Up to 1 hour before intercourse). 60 g 11   NIFEdipine (PROCARDIA XL/NIFEDICAL-XL) 90 MG 24 hr tablet Take 1 tablet (90 mg total) by mouth daily. 30 tablet 2    Norethindrone Acetate-Ethinyl Estradiol (JUNEL 1.5/30) 1.5-30 MG-MCG tablet Take 1 tablet by mouth daily. 28 tablet 11   acetaminophen (TYLENOL) 500 MG tablet Take 2 tablets (1,000 mg total) by mouth every 6 (six) hours as needed for fever or headache. (Patient not taking: Reported on 05/14/2021) 30 tablet 0   nitrofurantoin, macrocrystal-monohydrate, (MACROBID) 100 MG capsule Take 1 capsule (100 mg total) by mouth 2 (two) times daily. (Patient not taking: Reported on 07/29/2021) 14 capsule 1   Prenatal Vit-Fe Fumarate-FA (MULTIVITAMIN-PRENATAL) 27-0.8 MG TABS tablet Take 1 tablet by mouth daily at 12 noon. (Patient not taking: Reported on 07/29/2021)     No current facility-administered medications for this visit.    Review of Systems  Constitutional: Negative for chills, fatigue, fever and unexpected weight change.  HENT: Negative for trouble swallowing.  Eyes: Negative for loss of vision.  Respiratory: Negative for cough, shortness of breath and wheezing.  Cardiovascular: Negative for chest pain, leg swelling, palpitations and syncope.  GI: Negative for abdominal pain, blood in stool, diarrhea, nausea and vomiting.  GU: Negative for difficulty urinating, dysuria, frequency and hematuria.  Musculoskeletal: Negative for back pain, leg pain and joint pain.  Skin: Negative for rash.  Neurological: Negative for dizziness, headaches, light-headedness, numbness and seizures.  Psychiatric: Negative for behavioral problem, confusion, depressed mood and sleep disturbance.  Objective:  Objective   Vitals:   08/13/21 1137  BP: 114/70  Weight: 210 lb (95.3 kg)  Height: 5\' 8"  (1.727 m)   Body mass index is 31.93 kg/m.  Physical Exam Vitals and nursing note reviewed. Exam conducted with a chaperone present.  Constitutional:      Appearance: Normal appearance.  HENT:     Head: Normocephalic and atraumatic.  Eyes:     Extraocular Movements: Extraocular movements intact.     Pupils:  Pupils are equal, round, and reactive to light.  Cardiovascular:     Rate and Rhythm: Normal rate and regular rhythm.  Pulmonary:     Effort: Pulmonary effort is normal.     Breath sounds: Normal breath sounds.  Abdominal:     General: Abdomen is flat.     Palpations: Abdomen is soft.  Musculoskeletal:     Cervical back: Normal range of motion.  Skin:    General: Skin is warm and dry.  Neurological:     General: No focal deficit present.     Mental Status: She is alert and oriented to person, place, and time.  Psychiatric:        Behavior: Behavior normal.        Thought Content: Thought content normal.        Judgment: Judgment normal.    Assessment/Plan:     20 year old with abnormal uterine bleeding in the postpartum period Thin endometrium.  Will trial 3 months of oral contraceptive pill to see if this improves her bleeding pattern.  More than 20 minutes were spent face to face with the patient in the room, reviewing the medical record, labs and images, and coordinating care for the patient. The plan of management was discussed in detail and counseling was provided.     36 MD [facility], [facility] 08/13/2021 12:30 PM

## 2021-08-24 ENCOUNTER — Telehealth: Payer: Self-pay

## 2021-08-24 NOTE — Telephone Encounter (Signed)
Pt calling; [facility] rx'd pill for her to take weekly; has taken it for 5 wks now; is on last pill; feelis like she still has a yeast inf b/c it has yet to clear up.  [phone]

## 2021-09-27 ENCOUNTER — Telehealth: Payer: Self-pay

## 2021-09-27 NOTE — Telephone Encounter (Signed)
Pt aware to keep taking bc at the same time every day.

## 2021-09-27 NOTE — Telephone Encounter (Signed)
Pt calling; [facility] rx'd bcp for her in Sept; no period in Oct. Will bc stop period?  [phone]  Adv bc will stop periods; is still in the adjustment phase so don't be surprised if she starts bleeding; when no bleeding the lining doesn't built up so there is nothing for the body to get rid of.

## 2021-09-27 NOTE — Telephone Encounter (Signed)
Pt called 3:29 stating when she wiped she couldn't tell if it was blood or a clot or what; is trying to avoid ED. No pain. [phone]  Pt is on second week of second pack; neg preg test last week; had sex 2-3 weeks ago no condom but had started the pill.  Adv to repeat preg test if positive to call and schedule an appt; to keep taking pills on time every day.

## 2021-11-15 ENCOUNTER — Encounter: Payer: Self-pay | Admitting: Obstetrics and Gynecology

## 2021-11-15 ENCOUNTER — Other Ambulatory Visit (HOSPITAL_COMMUNITY)
Admission: RE | Admit: 2021-11-15 | Discharge: 2021-11-15 | Disposition: A | Discharge status: Home / Self Care | Payer: Medicaid Other | Source: Ambulatory Visit | Attending: Obstetrics and Gynecology | Admitting: Obstetrics and Gynecology

## 2021-11-15 ENCOUNTER — Other Ambulatory Visit: Payer: Self-pay

## 2021-11-15 ENCOUNTER — Ambulatory Visit: Payer: Medicaid Other | Admitting: Obstetrics and Gynecology

## 2021-11-15 VITALS — BP 120/70 | Ht 68.0 in | Wt 209.8 lb

## 2021-11-15 DIAGNOSIS — Z124 Encounter for screening for malignant neoplasm of cervix: Secondary | ICD-10-CM | POA: Diagnosis not present

## 2021-11-15 DIAGNOSIS — Z3009 Encounter for other general counseling and advice on contraception: Secondary | ICD-10-CM | POA: Diagnosis not present

## 2021-11-15 LAB — POCT URINE PREGNANCY: Preg Test, Ur: NEGATIVE

## 2021-11-15 MED ORDER — ETONOGESTREL-ETHINYL ESTRADIOL 0.12-0.015 MG/24HR VA RING: VAGINAL_RING | VAGINAL | 12 refills | Status: DC

## 2021-11-15 NOTE — Progress Notes (Signed)
Patient ID: Casey Garza, female   DOB: 2001-11-17, 20 y.o.   MRN: [id number]  Reason for Consult: Gynecologic Exam   Referred by Delsa Grana, PA-C  Subjective:     HPI:  Casey Garza is a 20 y.o. female she presents today for visit to discuss changing birth control methods. She is most interested in a patch. She reports with the OCP she has not had a period in 2 months. She used Depo Provera in the past but had irregular bleeding.  Gynecological History  Patient's last menstrual period was 09/11/2021.  Obstetrical History [id number]   Past Medical History:  Diagnosis Date   Acne    Vitamin D deficiency disease    Family History  Problem Relation Age of Onset   Diabetes Maternal Grandmother    Hypertension Maternal Grandmother    Breast cancer Neg Hx    Ovarian cancer Neg Hx    Colon cancer Neg Hx    Past Surgical History:  Procedure Laterality Date   CESAREAN SECTION  04/23/2021   Procedure: CESAREAN SECTION;  Surgeon: Homero Fellers, MD;  Location: [facility] ORS;  Service: Obstetrics;;   NO PAST SURGERIES      Short Social History:  Social History   Tobacco Use   Smoking status: Never   Smokeless tobacco: Never  Substance Use Topics   Alcohol use: No    Allergies  Allergen Reactions   Bactrim [Sulfamethoxazole-Trimethoprim] Rash    Rash, dizzy, nausea    Current Outpatient Medications  Medication Sig Dispense Refill   NIFEdipine (PROCARDIA XL/NIFEDICAL-XL) 90 MG 24 hr tablet Take 1 tablet (90 mg total) by mouth daily. 30 tablet 2   Norethindrone Acetate-Ethinyl Estradiol (JUNEL 1.5/30) 1.5-30 MG-MCG tablet Take 1 tablet by mouth daily. 28 tablet 11   acetaminophen (TYLENOL) 500 MG tablet Take 2 tablets (1,000 mg total) by mouth every 6 (six) hours as needed for fever or headache. (Patient not taking: Reported on 05/14/2021) 30 tablet 0   ciprofloxacin (CIPRO) 500 MG tablet Take 1 tablet (500 mg total) by mouth 2 (two) times daily. (Patient not taking:  Reported on 11/15/2021) 6 tablet 0   Lactic Ac-Citric Ac-Pot Bitart (PHEXXI) 1.8-1-0.4 % GEL Place 1 each vaginally as needed (Up to 1 hour before intercourse). (Patient not taking: Reported on 11/15/2021) 60 g 11   nitrofurantoin, macrocrystal-monohydrate, (MACROBID) 100 MG capsule Take 1 capsule (100 mg total) by mouth 2 (two) times daily. (Patient not taking: Reported on 07/29/2021) 14 capsule 1   Prenatal Vit-Fe Fumarate-FA (MULTIVITAMIN-PRENATAL) 27-0.8 MG TABS tablet Take 1 tablet by mouth daily at 12 noon. (Patient not taking: Reported on 07/29/2021)     No current facility-administered medications for this visit.    Review of Systems  Constitutional: Negative for chills, fatigue, fever and unexpected weight change.  HENT: Negative for trouble swallowing.  Eyes: Negative for loss of vision.  Respiratory: Negative for cough, shortness of breath and wheezing.  Cardiovascular: Negative for chest pain, leg swelling, palpitations and syncope.  GI: Negative for abdominal pain, blood in stool, diarrhea, nausea and vomiting.  GU: Negative for difficulty urinating, dysuria, frequency and hematuria.  Musculoskeletal: Negative for back pain, leg pain and joint pain.  Skin: Negative for rash.  Neurological: Negative for dizziness, headaches, light-headedness, numbness and seizures.  Psychiatric: Negative for behavioral problem, confusion, depressed mood and sleep disturbance.       Objective:  Objective   Vitals:   11/15/21 1021  BP: 120/70  Weight: 209  lb 12.8 oz (95.2 kg)  Height: 5\' 8"  (1.727 m)   Body mass index is 31.9 kg/m.  Physical Exam Vitals and nursing note reviewed. Exam conducted with a chaperone present.  Constitutional:      Appearance: Normal appearance. She is well-developed.  HENT:     Head: Normocephalic and atraumatic.  Eyes:     Extraocular Movements: Extraocular movements intact.     Pupils: Pupils are equal, round, and reactive to light.  Cardiovascular:      Rate and Rhythm: Normal rate and regular rhythm.  Pulmonary:     Effort: Pulmonary effort is normal. No respiratory distress.     Breath sounds: Normal breath sounds.  Abdominal:     General: Abdomen is flat.     Palpations: Abdomen is soft.  Genitourinary:    Comments: External: Normal appearing vulva. No lesions noted.  Speculum examination: Normal appearing cervix. No blood in the vaginal vault. No discharge.    Breast exam: exam not performed Musculoskeletal:        General: No signs of injury.  Skin:    General: Skin is warm and dry.  Neurological:     Mental Status: She is alert and oriented to person, place, and time.  Psychiatric:        Behavior: Behavior normal.        Thought Content: Thought content normal.        Judgment: Judgment normal.    Assessment/Plan:     20 yo here for birth control counseling.  Discussed with her current BMI a birth control patch is not recommended because it is considered less effective for pregnancy prevention.  Reviewed options such as a NuvaRing and IUD or an implant.  Patient is most interested in a NuvaRing and a prescription for this was sent to her pharmacy.  Pap smear performed today.  More than 20 minutes were spent face to face with the patient in the room, reviewing the medical record, labs and images, and coordinating care for the patient. The plan of management was discussed in detail and counseling was provided.    36 MD [facility], [facility] 11/15/2021 10:33 AM

## 2021-11-19 LAB — CYTOLOGY - PAP
Chlamydia: NEGATIVE
Comment: NEGATIVE
Comment: NEGATIVE
Comment: NORMAL
Diagnosis: NEGATIVE
Neisseria Gonorrhea: NEGATIVE
Trichomonas: NEGATIVE

## 2021-12-06 ENCOUNTER — Other Ambulatory Visit: Payer: Self-pay | Admitting: Obstetrics and Gynecology

## 2021-12-06 DIAGNOSIS — O165 Unspecified maternal hypertension, complicating the puerperium: Secondary | ICD-10-CM

## 2021-12-21 ENCOUNTER — Ambulatory Visit: Payer: Medicaid Other | Admitting: Unknown Physician Specialty

## 2021-12-21 ENCOUNTER — Ambulatory Visit: Payer: Self-pay

## 2021-12-21 ENCOUNTER — Other Ambulatory Visit: Payer: Self-pay | Admitting: Unknown Physician Specialty

## 2021-12-21 ENCOUNTER — Encounter: Payer: Self-pay | Admitting: Unknown Physician Specialty

## 2021-12-21 ENCOUNTER — Other Ambulatory Visit: Payer: Self-pay

## 2021-12-21 VITALS — BP 142/70 | HR 135 | Temp 98.5°F | Resp 16 | Ht 68.0 in | Wt 206.8 lb

## 2021-12-21 DIAGNOSIS — R0981 Nasal congestion: Secondary | ICD-10-CM | POA: Diagnosis not present

## 2021-12-21 DIAGNOSIS — J029 Acute pharyngitis, unspecified: Secondary | ICD-10-CM

## 2021-12-21 DIAGNOSIS — R059 Cough, unspecified: Secondary | ICD-10-CM | POA: Diagnosis not present

## 2021-12-21 LAB — POCT INFLUENZA A/B
Influenza A, POC: NEGATIVE
Influenza B, POC: NEGATIVE

## 2021-12-21 LAB — POCT RAPID STREP A (OFFICE): Rapid Strep A Screen: NEGATIVE

## 2021-12-21 MED ORDER — PREDNISONE 20 MG PO TABS: 20.0000 mg | ORAL_TABLET | Freq: Every day | ORAL | 0 refills | Status: AC

## 2021-12-21 NOTE — Telephone Encounter (Signed)
° °  Chief Complaint: Sore throat Symptoms: Left ear pain, can't hear out of ear, runny nose,cough Frequency: 2 days ago Pertinent Negatives: Patient denies fever Disposition: [] ED /[] Urgent Care (no appt availability in office) / [x] Appointment(In office/virtual)/ []  [facility]/ [] Home Care/ [] Refused Recommended Disposition /[] [facility]/ []  Follow-up with PCP Additional Notes: Appointment made per Cassandra in the practice.   Reason for Disposition  Earache also present  Answer Assessment - Initial Assessment Questions 1. ONSET: "When did the throat start hurting?" (Hours or days ago)      2 days ago 2. SEVERITY: "How bad is the sore throat?" (Scale 1-10; mild, moderate or severe)   - MILD (1-3):  doesn't interfere with eating or normal activities   - MODERATE (4-7): interferes with eating some solids and normal activities   - SEVERE (8-10):  excruciating pain, interferes with most normal activities   - SEVERE DYSPHAGIA: can't swallow liquids, drooling     Moderate 3. STREP EXPOSURE: "Has there been any exposure to strep within the past week?" If Yes, ask: "What type of contact occurred?"      No 4.  VIRAL SYMPTOMS: "Are there any symptoms of a cold, such as a runny nose, cough, hoarse voice or red eyes?"      Runny nose , left ear pain 5. FEVER: "Do you have a fever?" If Yes, ask: "What is your temperature, how was it measured, and when did it start?"     No 6. PUS ON THE TONSILS: "Is there pus on the tonsils in the back of your throat?"     No 7. OTHER SYMPTOMS: "Do you have any other symptoms?" (e.g., difficulty breathing, headache, rash)     Cough 8. PREGNANCY: "Is there any chance you are pregnant?" "When was your last menstrual period?"     No  Protocols used: Sore Throat-A-AH

## 2021-12-21 NOTE — Progress Notes (Signed)
BP (!) 142/70    Pulse (!) 135    Temp 98.5 F (36.9 C) (Oral)    Resp 16    Ht 5\' 8"  (1.727 m)    Wt 206 lb 12.8 oz (93.8 kg)    SpO2 95%    BMI 31.44 kg/m    Subjective:    Patient ID: , female    DOB: 2001-07-11, 21 y.o.   MRN: [id number]  HPI: Casey Garza is a 21 y.o. female  Chief Complaint  Patient presents with   Nasal Congestion   Cough   Generalized Body Aches   Sore Throat    All sx started x2 days ago.   URI  This is a new problem. Episode onset: 2 days ago. The problem has been gradually worsening. There has been no fever. Associated symptoms include congestion, coughing and headaches. Pertinent negatives include no abdominal pain, chest pain, joint swelling, nausea, plugged ear sensation or rhinorrhea. Associated symptoms comments: Chest pain with coughing or drinking. Treatments tried: Nyquil, sore throat lozenges,    Relevant past medical, surgical, family and social history reviewed and updated as indicated. Interim medical history since our last visit reviewed. Allergies and medications reviewed and updated.  Review of Systems  HENT:  Positive for congestion. Negative for rhinorrhea.   Respiratory:  Positive for cough.   Cardiovascular:  Negative for chest pain.  Gastrointestinal:  Negative for abdominal pain and nausea.  Neurological:  Positive for headaches.   Per HPI unless specifically indicated above     Objective:    BP (!) 142/70    Pulse (!) 135    Temp 98.5 F (36.9 C) (Oral)    Resp 16    Ht 5\' 8"  (1.727 m)    Wt 206 lb 12.8 oz (93.8 kg)    SpO2 95%    BMI 31.44 kg/m   Wt Readings from Last 3 Encounters:  12/21/21 206 lb 12.8 oz (93.8 kg)  11/15/21 209 lb 12.8 oz (95.2 kg)  08/13/21 210 lb (95.3 kg)    Physical Exam Constitutional:      General: She is not in acute distress.    Appearance: Normal appearance. She is well-developed.  HENT:     Head: Normocephalic and atraumatic.     Right Ear: Tympanic membrane and ear canal  normal.     Left Ear: Tympanic membrane and ear canal normal.     Nose: Rhinorrhea present.     Right Sinus: No maxillary sinus tenderness or frontal sinus tenderness.     Left Sinus: No maxillary sinus tenderness or frontal sinus tenderness.     Mouth/Throat:     Pharynx: Posterior oropharyngeal erythema present.  Eyes:     General: Lids are normal. No scleral icterus.       Right eye: No discharge.        Left eye: No discharge.     Conjunctiva/sclera: Conjunctivae normal.  Cardiovascular:     Rate and Rhythm: Normal rate and regular rhythm.  Pulmonary:     Effort: Pulmonary effort is normal. No respiratory distress.     Breath sounds: Normal breath sounds.  Abdominal:     Palpations: There is no hepatomegaly or splenomegaly.  Musculoskeletal:        General: Normal range of motion.  Skin:    Coloration: Skin is not pale.     Findings: No rash.  Neurological:     Mental Status: She is alert and oriented to  person, place, and time.  Psychiatric:        Behavior: Behavior normal.        Thought Content: Thought content normal.        Judgment: Judgment normal.    Results for orders placed or performed in visit on 12/21/21  POCT Influenza A/B  Result Value Ref Range   Influenza A, POC Negative Negative   Influenza B, POC Negative Negative  POCT rapid strep A  Result Value Ref Range   Rapid Strep A Screen Negative Negative      Assessment & Plan:   Problem List Items Addressed This Visit   None Visit Diagnoses     Cough, unspecified type    -  Primary   Relevant Orders   POCT Influenza A/B (Completed)   Novel Coronavirus, NAA ([facility])   Nasal congestion       Relevant Orders   POCT Influenza A/B (Completed)   Novel Coronavirus, NAA ([facility])   Sore throat       Relevant Orders   POCT rapid strep A (Completed)       Symptoms consistent with viral infection.  Strep and flu are negative.  Covid is pending.  Note given to be off of work.   Risks and benefits  of prednisone dicussed and given 20 mg daily for 5 days.    Follow up plan: Return if symptoms worsen or fail to improve.

## 2021-12-22 LAB — SARS-COV-2, NAA 2 DAY TAT

## 2021-12-22 LAB — SPECIMEN STATUS REPORT

## 2021-12-22 LAB — NOVEL CORONAVIRUS, NAA: SARS-CoV-2, NAA: NOT DETECTED

## 2022-03-13 ENCOUNTER — Other Ambulatory Visit: Payer: Self-pay | Admitting: Obstetrics and Gynecology

## 2022-03-13 DIAGNOSIS — O165 Unspecified maternal hypertension, complicating the puerperium: Secondary | ICD-10-CM

## 2022-05-03 ENCOUNTER — Emergency Department: Payer: Medicaid Other

## 2022-05-03 ENCOUNTER — Encounter: Payer: Self-pay | Admitting: *Deleted

## 2022-05-03 ENCOUNTER — Other Ambulatory Visit: Payer: Self-pay

## 2022-05-03 ENCOUNTER — Emergency Department
Admission: EM | Admit: 2022-05-03 | Discharge: 2022-05-03 | Disposition: A | Discharge status: Home / Self Care | Payer: Medicaid Other | Attending: Emergency Medicine | Admitting: Emergency Medicine

## 2022-05-03 DIAGNOSIS — R000 Tachycardia, unspecified: Secondary | ICD-10-CM | POA: Insufficient documentation

## 2022-05-03 DIAGNOSIS — R1011 Right upper quadrant pain: Secondary | ICD-10-CM | POA: Diagnosis not present

## 2022-05-03 DIAGNOSIS — R509 Fever, unspecified: Secondary | ICD-10-CM | POA: Diagnosis not present

## 2022-05-03 DIAGNOSIS — R1010 Upper abdominal pain, unspecified: Secondary | ICD-10-CM

## 2022-05-03 DIAGNOSIS — R0689 Other abnormalities of breathing: Secondary | ICD-10-CM | POA: Diagnosis not present

## 2022-05-03 DIAGNOSIS — R1084 Generalized abdominal pain: Secondary | ICD-10-CM | POA: Insufficient documentation

## 2022-05-03 DIAGNOSIS — R0902 Hypoxemia: Secondary | ICD-10-CM | POA: Diagnosis not present

## 2022-05-03 DIAGNOSIS — R001 Bradycardia, unspecified: Secondary | ICD-10-CM | POA: Diagnosis not present

## 2022-05-03 DIAGNOSIS — R109 Unspecified abdominal pain: Secondary | ICD-10-CM | POA: Diagnosis not present

## 2022-05-03 LAB — POC URINE PREG, ED: Preg Test, Ur: NEGATIVE

## 2022-05-03 LAB — HCG, QUANTITATIVE, PREGNANCY: hCG, Beta Chain, Quant, S: 1 m[IU]/mL (ref <5)

## 2022-05-03 LAB — COMPREHENSIVE METABOLIC PANEL
ALT: 18 U/L (ref 0–44)
AST: 24 U/L (ref 15–41)
Albumin: 4.2 g/dL (ref 3.5–5.0)
Alkaline Phosphatase: 47 U/L (ref 38–126)
Anion gap: 10 (ref 5–15)
BUN: 13 mg/dL (ref 6–20)
CO2: 20 mmol/L — ABNORMAL LOW (ref 22–32)
Calcium: 9.5 mg/dL (ref 8.9–10.3)
Chloride: 107 mmol/L (ref 98–111)
Creatinine, Ser: 1.02 mg/dL — ABNORMAL HIGH (ref 0.44–1.00)
GFR, Estimated: >60 mL/min (ref >60)
Glucose, Bld: 100 mg/dL — ABNORMAL HIGH (ref 70–99)
Potassium: 3.4 mmol/L — ABNORMAL LOW (ref 3.5–5.1)
Sodium: 137 mmol/L (ref 135–145)
Total Bilirubin: 0.6 mg/dL (ref 0.3–1.2)
Total Protein: 7.9 g/dL (ref 6.5–8.1)

## 2022-05-03 LAB — CBC
HCT: 42.9 % (ref 36.0–46.0)
Hemoglobin: 14.4 g/dL (ref 12.0–15.0)
MCH: 33 pg (ref 26.0–34.0)
MCHC: 33.6 g/dL (ref 30.0–36.0)
MCV: 98.4 fL (ref 80.0–100.0)
Platelets: 248 10*3/uL (ref 150–400)
RBC: 4.36 MIL/uL (ref 3.87–5.11)
RDW: 12.2 % (ref 11.5–15.5)
WBC: 7.6 10*3/uL (ref 4.0–10.5)
nRBC: 0 % (ref 0.0–0.2)

## 2022-05-03 LAB — URINALYSIS, ROUTINE W REFLEX MICROSCOPIC
Bilirubin Urine: NEGATIVE
Glucose, UA: NEGATIVE mg/dL
Hgb urine dipstick: NEGATIVE
Ketones, ur: 5 mg/dL — AB
Leukocytes,Ua: NEGATIVE
Nitrite: NEGATIVE
Protein, ur: NEGATIVE mg/dL
Specific Gravity, Urine: 1.015 (ref 1.005–1.030)
pH: 7 (ref 5.0–8.0)

## 2022-05-03 LAB — LIPASE, BLOOD: Lipase: 40 U/L (ref 11–51)

## 2022-05-03 MED ORDER — LACTATED RINGERS IV BOLUS
1000.0000 mL | Freq: Once | INTRAVENOUS | Status: AC
  Administered 2022-05-03: 1000 mL via INTRAVENOUS

## 2022-05-03 MED ORDER — ONDANSETRON 4 MG PO TBDP: 4.0000 mg | ORAL_TABLET | Freq: Three times a day (TID) | ORAL | 0 refills | Status: DC | PRN

## 2022-05-03 MED ORDER — HYDROMORPHONE HCL 1 MG/ML IJ SOLN
1.0000 mg | Freq: Once | INTRAMUSCULAR | Status: AC
  Administered 2022-05-03: 1 mg via INTRAVENOUS
  Filled 2022-05-03: qty 1

## 2022-05-03 MED ORDER — KETOROLAC TROMETHAMINE 15 MG/ML IJ SOLN
15.0000 mg | Freq: Once | INTRAMUSCULAR | Status: AC
  Administered 2022-05-03: 15 mg via INTRAVENOUS
  Filled 2022-05-03: qty 1

## 2022-05-03 MED ORDER — NAPROXEN 500 MG PO TABS: 500.0000 mg | ORAL_TABLET | Freq: Two times a day (BID) | ORAL | 0 refills | Status: DC

## 2022-05-03 MED ORDER — FAMOTIDINE 20 MG PO TABS: 20.0000 mg | ORAL_TABLET | Freq: Two times a day (BID) | ORAL | 0 refills | Status: DC

## 2022-05-03 MED ORDER — IOHEXOL 300 MG/ML  SOLN
100.0000 mL | Freq: Once | INTRAMUSCULAR | Status: AC | PRN
  Administered 2022-05-03: 100 mL via INTRAVENOUS

## 2022-05-03 MED ORDER — ACETAMINOPHEN 500 MG PO TABS
ORAL_TABLET | ORAL | Status: AC
  Filled 2022-05-03: qty 2

## 2022-05-03 MED ORDER — ACETAMINOPHEN 500 MG PO TABS
1000.0000 mg | ORAL_TABLET | Freq: Once | ORAL | Status: AC
  Administered 2022-05-03: 1000 mg via ORAL

## 2022-05-03 NOTE — ED Triage Notes (Signed)
Pt brought in via ems from home with upper abd pain .  No vag bleeding no urinary sx.  Pt reports pain in back.  Iv in place.  Fentanyl given by ems.  Pt alert.  Pt in recliner.

## 2022-05-03 NOTE — ED Triage Notes (Signed)
First Nurse Note:  Abdominal pain since noon.  C/O 10/10 pain across mid to lower abdomen.  Arrives by EMS.  HR  140 per report.  100 mcg fentanyl.  20 LAC

## 2022-05-03 NOTE — ED Provider Notes (Signed)
[facility] Provider Note    Event Date/Time   First MD Initiated Contact with Patient 05/03/22 1834     (approximate)   History   Chief Complaint: Abdominal Pain   HPI  Casey Garza is a 21 y.o. female with no significant past medical history who comes to the ED complaining of upper abdominal pain that started around noon today, constant, severe, radiating to right shoulder and back.  No aggravating or alleviating factors.  No chest pain or shortness of breath.  No dizziness or syncope.  No fever at home.  No recent illness.  No vomiting or diarrhea.  EMS gave 100 mcg of fentanyl.     Physical Exam   Triage Vital Signs: ED Triage Vitals  Enc Vitals Group     BP 05/03/22 1754 129/79     Pulse Rate 05/03/22 1754 (!) 119     Resp 05/03/22 1754 (!) 24     Temp 05/03/22 1754 (!) 101.2 F (38.4 C)     Temp Source 05/03/22 1754 Oral     SpO2 05/03/22 1754 100 %     Weight 05/03/22 1753 (!) 418 lb 14 oz (190 kg)     Height 05/03/22 1753 5\' 8"  (1.727 m)     Head Circumference --      Peak Flow --      Pain Score 05/03/22 1753 8     Pain Loc --      Pain Edu? --      Excl. in GC? --     Most recent vital signs: Vitals:   05/03/22 1754 05/03/22 2156  BP: 129/79 119/68  Pulse: (!) 119 89  Resp: (!) 24 15  Temp: (!) 101.2 F (38.4 C) 99 F (37.2 C)  SpO2: 100% 99%    General: Awake, no distress.  CV:  Good peripheral perfusion.  Tachycardia heart rate 110 Resp:  Normal effort.  Clear to auscultation bilaterally Abd:  No distention.  Soft with generalized tenderness.  No rigidity or rebound or guarding. Other:  No rash.  Moist oral mucosa.   ED Results / Procedures / Treatments   Labs (all labs ordered are listed, but only abnormal results are displayed) Labs Reviewed  COMPREHENSIVE METABOLIC PANEL - Abnormal; Notable for the following components:      Result Value   Potassium 3.4 (*)    CO2 20 (*)    Glucose, Bld 100 (*)     Creatinine, Ser 1.02 (*)    All other components within normal limits  URINALYSIS, ROUTINE W REFLEX MICROSCOPIC - Abnormal; Notable for the following components:   Color, Urine YELLOW (*)    APPearance CLEAR (*)    Ketones, ur 5 (*)    All other components within normal limits  LIPASE, BLOOD  CBC  HCG, QUANTITATIVE, PREGNANCY  POC URINE PREG, ED     EKG Interpreted by me Sinus tachycardia rate 110.  Normal axis, normal intervals.  Normal QRS ST segments and T waves.   RADIOLOGY CT abdomen pelvis viewed and interpreted by me, unremarkable without free air, bowel obstruction, pancreatitis.  Radiology report reviewed.   PROCEDURES:  Procedures   MEDICATIONS ORDERED IN ED: Medications  acetaminophen (TYLENOL) tablet 1,000 mg (1,000 mg Oral See Procedure Record 05/03/22 1907)  ketorolac (TORADOL) 15 MG/ML injection 15 mg (15 mg Intravenous Given 05/03/22 1900)  HYDROmorphone (DILAUDID) injection 1 mg (1 mg Intravenous Given 05/03/22 1900)  iohexol (OMNIPAQUE) 300 MG/ML solution 100 mL (  100 mLs Intravenous Contrast Given 05/03/22 1949)  HYDROmorphone (DILAUDID) injection 1 mg (1 mg Intravenous Given 05/03/22 2155)  lactated ringers bolus 1,000 mL (1,000 mLs Intravenous New Bag/Given 05/03/22 2153)     IMPRESSION / MDM / ASSESSMENT AND PLAN / ED COURSE  I reviewed the triage vital signs and the nursing notes.                              Differential diagnosis includes, but is not limited to, cholecystitis, choledocholithiasis, pancreatitis, gastritis, ABD, viral illness, appendicitis  Patient's presentation is most consistent with acute presentation with potential threat to life or bodily function.  Patient presents with severe radiating abdominal pain.  Initial labs are normal, pregnancy negative.  Vitals show tachycardia and fever.  She is not septic.  Initial CT unremarkable and on reassessment at 9:00 PM, abdominal tenderness is localized to the right upper quadrant.  Will obtain  right upper quadrant ultrasound, give IV Dilaudid for additional pain relief.  ----------------------------------------- 11:22 PM on 05/03/2022 ----------------------------------------- Sx improving. [place] RUQ negative. Suspect viral illness. Double acalculous cholecystitis with negative labs, low grade fever, nontoxic appearance. HIDA not needed. Does not require admission and can f/u with PCP with supportive care in the meantime.        FINAL CLINICAL IMPRESSION(S) / ED DIAGNOSES   Final diagnoses:  Pain of upper abdomen     Rx / DC Orders   ED Discharge Orders          Ordered    ondansetron (ZOFRAN-ODT) 4 MG disintegrating tablet  Every 8 hours PRN        05/03/22 2321    famotidine (PEPCID) 20 MG tablet  2 times daily        05/03/22 2321    naproxen (NAPROSYN) 500 MG tablet  2 times daily with meals        05/03/22 2321             Note:  This document was prepared using Dragon voice recognition software and may include unintentional dictation errors.   Sharman Cheek, MD 05/03/22 [phone]

## 2022-05-03 NOTE — ED Notes (Signed)
Pt unable to void at this time. 

## 2022-05-04 ENCOUNTER — Telehealth: Payer: Self-pay

## 2022-05-04 NOTE — Telephone Encounter (Signed)
Transition Care Management Unsuccessful Follow-up Telephone Call  Date of discharge and from where:  05/03/2022-[facility]  Attempts:  1st Attempt  Reason for unsuccessful TCM follow-up call:  Left voice message    

## 2022-05-05 NOTE — Telephone Encounter (Signed)
Transition Care Management Follow-up Telephone Call Date of discharge and from where: 05/03/2022 from [facility] How have you been since you were released from the hospital? Patient stated that she is feeling some better after taking the famotidine and the naproxen.  Any questions or concerns? No  Items Reviewed: Did the pt receive and understand the discharge instructions provided? Yes  Medications obtained and verified? Yes  Other? No  Any new allergies since your discharge? No  Dietary orders reviewed? No Do you have support at home? Yes   Functional Questionnaire: (I = Independent and D = Dependent) ADLs: I  Bathing/Dressing- I  Meal Prep- I  Eating- I  Maintaining continence- I  Transferring/Ambulation- I  Managing Meds- I   Follow up appointments reviewed:  PCP Hospital f/u appt confirmed? No   Specialist Hospital f/u appt confirmed? No   Are transportation arrangements needed? No  If their condition worsens, is the pt aware to call PCP or go to the Emergency Dept.? Yes Was the patient provided with contact information for the PCP's office or ED? Yes Was to pt encouraged to call back with questions or concerns? Yes

## 2022-05-06 ENCOUNTER — Encounter: Payer: Self-pay | Admitting: Family Medicine

## 2022-05-06 ENCOUNTER — Ambulatory Visit: Payer: Medicaid Other | Admitting: Family Medicine

## 2022-05-06 ENCOUNTER — Other Ambulatory Visit: Payer: Self-pay | Admitting: Family Medicine

## 2022-05-06 ENCOUNTER — Ambulatory Visit
Admission: RE | Admit: 2022-05-06 | Discharge: 2022-05-06 | Disposition: A | Discharge status: Home / Self Care | Payer: Medicaid Other | Source: Ambulatory Visit | Attending: Family Medicine | Admitting: Family Medicine

## 2022-05-06 VITALS — BP 118/66 | HR 97 | Temp 98.6°F | Resp 16 | Ht 68.0 in | Wt 197.4 lb

## 2022-05-06 DIAGNOSIS — R109 Unspecified abdominal pain: Secondary | ICD-10-CM | POA: Diagnosis not present

## 2022-05-06 DIAGNOSIS — Z09 Encounter for follow-up examination after completed treatment for conditions other than malignant neoplasm: Secondary | ICD-10-CM | POA: Diagnosis not present

## 2022-05-06 DIAGNOSIS — R1011 Right upper quadrant pain: Secondary | ICD-10-CM

## 2022-05-06 DIAGNOSIS — E876 Hypokalemia: Secondary | ICD-10-CM

## 2022-05-06 LAB — POCT URINALYSIS DIPSTICK
Blood, UA: NEGATIVE
Glucose, UA: NEGATIVE
Ketones, UA: NEGATIVE
Nitrite, UA: NEGATIVE
Protein, UA: NEGATIVE
Spec Grav, UA: 1.01 (ref 1.010–1.025)
Urobilinogen, UA: 0.2 E.U./dL
pH, UA: 6.5 (ref 5.0–8.0)

## 2022-05-06 MED ORDER — CEFTRIAXONE SODIUM 500 MG IJ SOLR
500.0000 mg | Freq: Once | INTRAMUSCULAR | Status: AC
  Administered 2022-05-06: 500 mg via INTRAMUSCULAR

## 2022-05-06 MED ORDER — LIDOCAINE HCL (PF) 1 % IJ SOLN
1.0000 mL | Freq: Once | INTRAMUSCULAR | Status: AC
  Administered 2022-05-06: 1 mL via INTRADERMAL

## 2022-05-06 MED ORDER — PANTOPRAZOLE SODIUM 40 MG PO TBEC: 40.0000 mg | DELAYED_RELEASE_TABLET | Freq: Two times a day (BID) | ORAL | 3 refills | Status: DC

## 2022-05-06 MED ORDER — SUCRALFATE 1 G PO TABS: 1.0000 g | ORAL_TABLET | Freq: Three times a day (TID) | ORAL | 1 refills | Status: DC | PRN

## 2022-05-06 MED ORDER — KETOROLAC TROMETHAMINE 60 MG/2ML IM SOLN
60.0000 mg | Freq: Once | INTRAMUSCULAR | Status: AC
  Administered 2022-05-06: 30 mg via INTRAMUSCULAR

## 2022-05-06 NOTE — Progress Notes (Signed)
Patient ID: Casey Garza, female    DOB: March 03, 2001, 21 y.o.   MRN: [id number]  PCP: Danelle Berry, PA-C  Chief Complaint  Patient presents with   Hospitalization Follow-up    Pt continues feeling bad, pain still the same in her right side of her stomach.    Subjective:   Casey Garza is a 21 y.o. female, presents to clinic with CC of the following:  HPI  ER f/up from 6/6 Sudden abd pain started while she was at work and it quickly worsened and she went to the ED via EMS  Pain is a little better than when she went to the ER but still severe, crampy and stabbing, right side goes to back  Comes and goes Better with laying down Taking zofran and pepcid  Worse with movement and walking  Worse with eating, didn't eat wed cause of sx Yesterday and today she nibbled on food   Patient Active Problem List   Diagnosis Date Noted   Delivery of pregnancy by cesarean section    Severe preeclampsia, third trimester 04/22/2021   Intrauterine growth restriction affecting antepartum care of mother in third trimester 04/22/2021   Elevated blood pressure affecting pregnancy in third trimester, antepartum 04/21/2021   Labor and delivery, indication for care 03/08/2021   Encounter for supervision of normal first pregnancy in third trimester 11/23/2020      Current Outpatient Medications:    acetaminophen (TYLENOL) 500 MG tablet, Take 2 tablets (1,000 mg total) by mouth every 6 (six) hours as needed for fever or headache., Disp: 30 tablet, Rfl: 0   etonogestrel-ethinyl estradiol (NUVARING) 0.12-0.015 MG/24HR vaginal ring, Insert vaginally and leave in place for 3 consecutive weeks, then remove for 1 week., Disp: 1 each, Rfl: 12   famotidine (PEPCID) 20 MG tablet, Take 1 tablet (20 mg total) by mouth 2 (two) times daily., Disp: 60 tablet, Rfl: 0   Lactic Ac-Citric Ac-Pot Bitart (PHEXXI) 1.8-1-0.4 % GEL, Place 1 each vaginally as needed (Up to 1 hour before intercourse)., Disp: 60 g, Rfl:  11   naproxen (NAPROSYN) 500 MG tablet, Take 1 tablet (500 mg total) by mouth 2 (two) times daily with a meal., Disp: 20 tablet, Rfl: 0   NIFEdipine (PROCARDIA XL/NIFEDICAL-XL) 90 MG 24 hr tablet, TAKE 1 TABLET BY MOUTH EVERY DAY, Disp: 30 tablet, Rfl: 2   nitrofurantoin, macrocrystal-monohydrate, (MACROBID) 100 MG capsule, Take 1 capsule (100 mg total) by mouth 2 (two) times daily., Disp: 14 capsule, Rfl: 1   ondansetron (ZOFRAN-ODT) 4 MG disintegrating tablet, Take 1 tablet (4 mg total) by mouth every 8 (eight) hours as needed for nausea or vomiting., Disp: 20 tablet, Rfl: 0   Prenatal Vit-Fe Fumarate-FA (MULTIVITAMIN-PRENATAL) 27-0.8 MG TABS tablet, Take 1 tablet by mouth daily at 12 noon., Disp: , Rfl:    Allergies  Allergen Reactions   Bactrim [Sulfamethoxazole-Trimethoprim] Rash    Rash, dizzy, nausea     Social History   Tobacco Use   Smoking status: Never   Smokeless tobacco: Never  Vaping Use   Vaping Use: Former  Substance Use Topics   Alcohol use: No   Drug use: Not Currently      Chart Review Today: I personally reviewed active problem list, medication list, allergies, family history, social history, health maintenance, notes from last encounter, lab results, imaging with the patient/caregiver today.   Review of Systems  Constitutional:  Positive for appetite change, chills, fatigue and fever.  HENT: Negative.  Eyes: Negative.   Respiratory: Negative.    Cardiovascular: Negative.   Gastrointestinal:  Positive for abdominal pain and nausea. Negative for rectal pain and vomiting.  Endocrine: Negative.   Genitourinary:  Positive for flank pain. Negative for decreased urine volume, difficulty urinating, dyspareunia, frequency, hematuria, menstrual problem, pelvic pain and urgency.  Skin: Negative.   Allergic/Immunologic: Negative.   Neurological: Negative.   Hematological: Negative.   Psychiatric/Behavioral: Negative.    All other systems reviewed and are  negative.      Objective:   Vitals:   05/06/22 1016  BP: 118/66  Pulse: 97  Resp: 16  Temp: 98.6 F (37 C)  TempSrc: Oral  SpO2: 100%  Weight: 197 lb 6.4 oz (89.5 kg)  Height: 5\' 8"  (1.727 m)    Body mass index is 30.01 kg/m.  Physical Exam Vitals and nursing note reviewed.  Constitutional:      General: She is not in acute distress.    Appearance: Normal appearance. She is well-developed, well-groomed and overweight. She is ill-appearing. She is not toxic-appearing or diaphoretic.     Interventions: Face mask in place.  HENT:     Head: Normocephalic and atraumatic.     Right Ear: External ear normal.     Left Ear: External ear normal.     Nose: Nose normal.  Eyes:     General: Lids are normal. No scleral icterus.       Right eye: No discharge.        Left eye: No discharge.     Conjunctiva/sclera: Conjunctivae normal.  Neck:     Trachea: Phonation normal. No tracheal deviation.  Cardiovascular:     Rate and Rhythm: Normal rate and regular rhythm.     Pulses: Normal pulses.          Radial pulses are 2+ on the right side and 2+ on the left side.       Posterior tibial pulses are 2+ on the right side and 2+ on the left side.     Heart sounds: Normal heart sounds. No murmur heard.    No friction rub. No gallop.  Pulmonary:     Effort: No tachypnea, accessory muscle usage or respiratory distress.     Breath sounds: No stridor. Examination of the right-lower field reveals decreased breath sounds. Examination of the left-lower field reveals decreased breath sounds. Decreased breath sounds present. No wheezing, rhonchi or rales.     Comments: Splinted inspiratory effort Chest:     Chest wall: No tenderness.  Abdominal:     General: Bowel sounds are normal. There is no distension.     Palpations: Abdomen is soft. There is no mass or pulsatile mass.     Tenderness: There is abdominal tenderness in the right upper quadrant and right lower quadrant. There is right CVA  tenderness and guarding. There is no left CVA tenderness or rebound.     Hernia: No hernia is present.     Comments: Abd soft, non-distended  Skin:    General: Skin is warm.     Coloration: Skin is not jaundiced or pale.     Findings: No rash.     Comments: Hot and mildly sweaty to the touch  Neurological:     Mental Status: She is alert.     Motor: No abnormal muscle tone.     Gait: Gait normal.  Psychiatric:        Mood and Affect: Mood normal.  Speech: Speech normal.        Behavior: Behavior normal. Behavior is cooperative.      Results for orders placed or performed during the hospital encounter of 05/03/22  Lipase, blood  Result Value Ref Range   Lipase 40 11 - 51 U/L  Comprehensive metabolic panel  Result Value Ref Range   Sodium 137 135 - 145 mmol/L   Potassium 3.4 (L) 3.5 - 5.1 mmol/L   Chloride 107 98 - 111 mmol/L   CO2 20 (L) 22 - 32 mmol/L   Glucose, Bld 100 (H) 70 - 99 mg/dL   BUN 13 6 - 20 mg/dL   Creatinine, Ser 8.54 (H) 0.44 - 1.00 mg/dL   Calcium 9.5 8.9 - 62.7 mg/dL   Total Protein 7.9 6.5 - 8.1 g/dL   Albumin 4.2 3.5 - 5.0 g/dL   AST 24 15 - 41 U/L   ALT 18 0 - 44 U/L   Alkaline Phosphatase 47 38 - 126 U/L   Total Bilirubin 0.6 0.3 - 1.2 mg/dL   GFR, Estimated >03 >50 mL/min   Anion gap 10 5 - 15  CBC  Result Value Ref Range   WBC 7.6 4.0 - 10.5 K/uL   RBC 4.36 3.87 - 5.11 MIL/uL   Hemoglobin 14.4 12.0 - 15.0 g/dL   HCT 09.3 81.8 - 29.9 %   MCV 98.4 80.0 - 100.0 fL   MCH 33.0 26.0 - 34.0 pg   MCHC 33.6 30.0 - 36.0 g/dL   RDW 37.1 69.6 - 78.9 %   Platelets 248 150 - 400 K/uL   nRBC 0.0 0.0 - 0.2 %  Urinalysis, Routine w reflex microscopic  Result Value Ref Range   Color, Urine YELLOW (A) YELLOW   APPearance CLEAR (A) CLEAR   Specific Gravity, Urine 1.015 1.005 - 1.030   pH 7.0 5.0 - 8.0   Glucose, UA NEGATIVE NEGATIVE mg/dL   Hgb urine dipstick NEGATIVE NEGATIVE   Bilirubin Urine NEGATIVE NEGATIVE   Ketones, ur 5 (A) NEGATIVE mg/dL    Protein, ur NEGATIVE NEGATIVE mg/dL   Nitrite NEGATIVE NEGATIVE   Leukocytes,Ua NEGATIVE NEGATIVE  hCG, quantitative, pregnancy  Result Value Ref Range   hCG, Beta Chain, Quant, S 1 <5 mIU/mL  POC urine preg, ED  Result Value Ref Range   Preg Test, Ur Negative Negative       Assessment & Plan:   1. Right upper quadrant abdominal pain Severe RUQ pain onset 4 days ago constant with waves or worsening pain - seem to come at random but pain also exacerbated by movement, palpation and eating Severe nausea Pt was febrile at presentation in ED She's had no diarrhea or vomiting, no vaginal sx no urinary sx, not sure what infectious etiology there may be? They gave her zofran and pepcid, not much improvement, still severe pain and left work today and cannot eat  On exam RUQ right side and RLQ very ttp with guarding, pt actually shaking and in evident pain Right CVA ttp Not sure if she need HIDA scan?  Seems colicky in nature but there was no gallstones or cholycystitis - I am concerned it may also be pyelo or kidney stone - though that was neg on ER work up as well (no Urine culture done with unremarkable dip) renal US? To eval Pain does start in RUQ radiates to back and around right side to RLQ  - pantoprazole (PROTONIX) 40 MG tablet; Take 1 tablet (40 mg total) by mouth  2 (two) times daily.  Dispense: 60 tablet; Refill: 3 - sucralfate (CARAFATE) 1 g tablet; Take 1 tablet (1 g total) by mouth 3 (three) times daily with meals as needed (for abd pain, cramping upper GI issues). for GERD/reflux/epigastric pain when eating or drinking  Dispense: 30 tablet; Refill: 1 - CBC with Differential/Platelet - H. pylori breath test - Comprehensive metabolic panel - US Renal - POCT urinalysis dipstick - Urine Culture  2. Right flank pain See aboe - POCT urinalysis dipstick - Urine Culture  3. Hypokalemia recheck - Comprehensive metabolic panel   4. Encounter for examination following  treatment at hospital ER visit reviewed neg RUQ [place], neg CT abd pelvis UA + ketones otherwise unremarkable. Mild hypokalemia   Meds to cover more aggressively for upper GI pathology? May need GI f/up or HIDA?  Given toradol and rocephin in office today     [place] scheduled today - if any worsening she would need to go back to the ED  Results for orders placed or performed in visit on 05/06/22  POCT urinalysis dipstick  Result Value Ref Range   Color, UA Yellow    Clarity, UA Cloudy    Glucose, UA Negative Negative   Bilirubin, UA Small    Ketones, UA Negative    Spec Grav, UA 1.010 1.010 - 1.025   Blood, UA Negative    pH, UA 6.5 5.0 - 8.0   Protein, UA Negative Negative   Urobilinogen, UA 0.2 0.2 or 1.0 E.U./dL   Nitrite, UA Negative    Leukocytes, UA Trace (A) Negative   Appearance Yellow    Odor Foul       ICD-10-CM   1. Right upper quadrant abdominal pain  R10.11 pantoprazole (PROTONIX) 40 MG tablet    sucralfate (CARAFATE) 1 g tablet    CBC with Differential/Platelet    H. pylori breath test    Comprehensive metabolic panel    POCT urinalysis dipstick    Urine Culture    cefTRIAXone (ROCEPHIN) injection 500 mg    ketorolac (TORADOL) injection 60 mg    lidocaine (PF) (XYLOCAINE) 1 % injection 1 mL    CANCELED: US Renal    2. Right flank pain  R10.9 POCT urinalysis dipstick    Urine Culture    cefTRIAXone (ROCEPHIN) injection 500 mg    ketorolac (TORADOL) injection 60 mg    lidocaine (PF) (XYLOCAINE) 1 % injection 1 mL    3. Hypokalemia  E87.6 Comprehensive metabolic panel    4. Encounter for examination following treatment at hospital  Z09      Meds given in clinic - I'm considering covering for pyelo - UA was again unremarkable though nitrite neg, no hematuria  30 mg toradol for pain  Will be following results and in touch with pt today for tx and follow up  Danelle Berry, PA-C 05/06/22 10:30 AM

## 2022-05-07 LAB — CBC WITH DIFFERENTIAL/PLATELET
Basophils Absolute: 0 10*3/uL (ref 0.0–0.2)
Basos: 0 %
EOS (ABSOLUTE): 0 10*3/uL (ref 0.0–0.4)
Eos: 0 %
Hematocrit: 37.6 % (ref 34.0–46.6)
Hemoglobin: 13 g/dL (ref 11.1–15.9)
Immature Grans (Abs): 0 10*3/uL (ref 0.0–0.1)
Immature Granulocytes: 0 %
Lymphocytes Absolute: 1.9 10*3/uL (ref 0.7–3.1)
Lymphs: 42 %
MCH: 33.4 pg — ABNORMAL HIGH (ref 26.6–33.0)
MCHC: 34.6 g/dL (ref 31.5–35.7)
MCV: 97 fL (ref 79–97)
Monocytes Absolute: 0.4 10*3/uL (ref 0.1–0.9)
Monocytes: 9 %
Neutrophils Absolute: 2.2 10*3/uL (ref 1.4–7.0)
Neutrophils: 49 %
Platelets: 229 10*3/uL (ref 150–450)
RBC: 3.89 x10E6/uL (ref 3.77–5.28)
RDW: 12.5 % (ref 11.7–15.4)
WBC: 4.6 10*3/uL (ref 3.4–10.8)

## 2022-05-07 LAB — COMPREHENSIVE METABOLIC PANEL
ALT: 19 IU/L (ref 0–32)
AST: 19 IU/L (ref 0–40)
Albumin/Globulin Ratio: 1.4 (ref 1.2–2.2)
Albumin: 4.1 g/dL (ref 3.9–5.0)
Alkaline Phosphatase: 50 IU/L (ref 44–121)
BUN/Creatinine Ratio: 14 (ref 9–23)
BUN: 12 mg/dL (ref 6–20)
Bilirubin Total: 0.3 mg/dL (ref 0.0–1.2)
CO2: 22 mmol/L (ref 20–29)
Calcium: 9.2 mg/dL (ref 8.7–10.2)
Chloride: 106 mmol/L (ref 96–106)
Creatinine, Ser: 0.88 mg/dL (ref 0.57–1.00)
Globulin, Total: 2.9 g/dL (ref 1.5–4.5)
Glucose: 81 mg/dL (ref 70–99)
Potassium: 4 mmol/L (ref 3.5–5.2)
Sodium: 142 mmol/L (ref 134–144)
Total Protein: 7 g/dL (ref 6.0–8.5)
eGFR: 96 mL/min/{1.73_m2} (ref >59)

## 2022-05-07 LAB — H. PYLORI BREATH TEST: H pylori Breath Test: NEGATIVE

## 2022-05-09 ENCOUNTER — Other Ambulatory Visit: Payer: Self-pay | Admitting: Family Medicine

## 2022-05-09 LAB — URINE CULTURE
MICRO NUMBER:: 13507283
SPECIMEN QUALITY:: ADEQUATE

## 2022-07-14 ENCOUNTER — Telehealth: Payer: Self-pay

## 2022-07-14 NOTE — Telephone Encounter (Addendum)
Spoke with patient. Inquired if she is still taking NIFEdipine (PROCARDIA XL/NIFEDICAL-XL) 90 MG 24 hr tablet. Patient reports she still takes it when she has issues. Patient delivered 04/23/2021. Advised post partum HTN typically resolves within a few months. Any issues with HTN past that point should be managed by PCP. Advised prescribing MD Dr. Jerene Pitch is no long with our practice. Patient will follow up with PCP for BP medication management.

## 2022-07-14 NOTE — Telephone Encounter (Signed)
Fax refill request received for NIFEdipine (PROCARDIA XL/NIFEDICAL-XL) 90 MG 24 hr tablet Last filled:05/08/22

## 2022-07-14 NOTE — Telephone Encounter (Signed)
Returned fax to pharmacy with note Patient advised to follow up with [facility] for BP medication management.

## 2022-08-17 ENCOUNTER — Ambulatory Visit: Payer: Self-pay | Admitting: *Deleted

## 2022-08-17 ENCOUNTER — Telehealth (INDEPENDENT_AMBULATORY_CARE_PROVIDER_SITE_OTHER): Payer: Medicaid Other | Admitting: Family Medicine

## 2022-08-17 ENCOUNTER — Encounter: Payer: Self-pay | Admitting: Family Medicine

## 2022-08-17 VITALS — BP 122/76 | HR 103 | Temp 98.8°F | Resp 16 | Ht 68.0 in | Wt 192.0 lb

## 2022-08-17 DIAGNOSIS — R0981 Nasal congestion: Secondary | ICD-10-CM | POA: Diagnosis not present

## 2022-08-17 DIAGNOSIS — R432 Parageusia: Secondary | ICD-10-CM

## 2022-08-17 DIAGNOSIS — R051 Acute cough: Secondary | ICD-10-CM | POA: Diagnosis not present

## 2022-08-17 MED ORDER — BENZONATATE 100 MG PO CAPS: 100.0000 mg | ORAL_CAPSULE | Freq: Two times a day (BID) | ORAL | 0 refills | Status: DC | PRN

## 2022-08-17 NOTE — Progress Notes (Signed)
Name: Casey Garza   MRN: [id number]    DOB: 2001-06-08   Date:08/17/2022       Progress Note  Subjective  Chief Complaint  Chief Complaint  Patient presents with   Loss of taste    X2 days   Headache   Nasal Congestion    HPI  Viral illness: Kerly developed rhinorrhea, nasal congestion, some headache, mild cough, no wheezing or SOB. This morning she was unable to taste. She did not get checked for COVID. She works at [employer]. She denies fever or chills.   Discussed paxlovid but since young and healthy we will try symptomatic therapy  Patient Active Problem List   Diagnosis Date Noted   Delivery of pregnancy by cesarean section    Severe preeclampsia, third trimester 04/22/2021   Elevated blood pressure affecting pregnancy in third trimester, antepartum 04/21/2021    Past Surgical History:  Procedure Laterality Date   CESAREAN SECTION  04/23/2021   Procedure: CESAREAN SECTION;  Surgeon: Homero Fellers, MD;  Location: [facility] ORS;  Service: Obstetrics;;   NO PAST SURGERIES      Family History  Problem Relation Age of Onset   Diabetes Maternal Grandmother    Hypertension Maternal Grandmother    Breast cancer Neg Hx    Ovarian cancer Neg Hx    Colon cancer Neg Hx     Social History   Tobacco Use   Smoking status: Never   Smokeless tobacco: Never  Substance Use Topics   Alcohol use: No     Current Outpatient Medications:    acetaminophen (TYLENOL) 500 MG tablet, Take 2 tablets (1,000 mg total) by mouth every 6 (six) hours as needed for fever or headache., Disp: 30 tablet, Rfl: 0   benzonatate (TESSALON) 100 MG capsule, Take 1 capsule (100 mg total) by mouth 2 (two) times daily as needed for cough., Disp: 40 capsule, Rfl: 0   etonogestrel-ethinyl estradiol (NUVARING) 0.12-0.015 MG/24HR vaginal ring, Insert vaginally and leave in place for 3 consecutive weeks, then remove for 1 week., Disp: 1 each, Rfl: 12   ondansetron (ZOFRAN-ODT) 4 MG disintegrating tablet,  Take 1 tablet (4 mg total) by mouth every 8 (eight) hours as needed for nausea or vomiting. (Patient not taking: Reported on 08/17/2022), Disp: 20 tablet, Rfl: 0  Allergies  Allergen Reactions   Bactrim [Sulfamethoxazole-Trimethoprim] Rash    Rash, dizzy, nausea    I personally reviewed active problem list, medication list, allergies, family history, social history, health maintenance with the patient/caregiver today.   ROS  Ten systems reviewed and is negative except as mentioned in HPI   Objective  Vitals:   08/17/22 1543  BP: 122/76  Pulse: (!) 103  Resp: 16  Temp: 98.8 F (37.1 C)  SpO2: 100%  Weight: 192 lb (87.1 kg)  Height: 5\' 8"  (1.727 m)    Body mass index is 29.19 kg/m.  Physical Exam  Constitutional: Patient appears well-developed and well-nourished.  No distress.  HEENT: head atraumatic, normocephalic, pupils equal and reactive to light, ears normal , neck supple.  Cardiovascular: Normal rate, regular rhythm and normal heart sounds.  No murmur heard. No BLE edema. Pulmonary/Chest: Effort normal and breath sounds normal. No respiratory distress. Abdominal: Soft.  There is no tenderness. Psychiatric: Patient has a normal mood and affect. behavior is normal. Judgment and thought content normal.   PHQ2/9:    08/17/2022    1:53 PM 05/06/2022   10:18 AM 12/21/2021   11:32 AM 10/06/2020  2:43 PM 06/15/2020   10:48 AM  Depression screen PHQ 2/9  Decreased Interest 0 0 0 0 0  Down, Depressed, Hopeless 0 0 0 0 0  PHQ - 2 Score 0 0 0 0 0  Altered sleeping 0 0 0  0  Tired, decreased energy 0 0 0  0  Change in appetite 0 0 0  0  Feeling bad or failure about yourself  0 0 0  0  Trouble concentrating 0 0 0  0  Moving slowly or fidgety/restless 0 0 0  0  Suicidal thoughts 0 0 0  0  PHQ-9 Score 0 0 0  0  Difficult doing work/chores  Not difficult at all Not difficult at all  Not difficult at all    phq 9 is negative   Fall Risk:    08/17/2022    1:52 PM  05/06/2022   10:18 AM 12/21/2021   11:32 AM 10/06/2020    2:42 PM 06/15/2020   10:48 AM  Fall Risk   Falls in the past year? 0 0 0 0 0  Number falls in past yr: 0 0 0 0 0  Injury with Fall? 0 0 0 0 0  Risk for fall due to : No Fall Risks No Fall Risks No Fall Risks    Follow up Falls prevention discussed Falls prevention discussed;Education provided Falls prevention discussed Falls evaluation completed Falls evaluation completed      Functional Status Survey: Is the patient deaf or have difficulty hearing?: No Does the patient have difficulty seeing, even when wearing glasses/contacts?: No Does the patient have difficulty concentrating, remembering, or making decisions?: No Does the patient have difficulty walking or climbing stairs?: No Does the patient have difficulty dressing or bathing?: No Does the patient have difficulty doing errands alone such as visiting a doctor's office or shopping?: No    Assessment & Plan  1. Loss of taste  - Novel Coronavirus, NAA ([facility]) - benzonatate (TESSALON) 100 MG capsule; Take 1 capsule (100 mg total) by mouth 2 (two) times daily as needed for cough.  Dispense: 40 capsule; Refill: 0  2. Nasal congestion  - Novel Coronavirus, NAA ([facility]) - benzonatate (TESSALON) 100 MG capsule; Take 1 capsule (100 mg total) by mouth 2 (two) times daily as needed for cough.  Dispense: 40 capsule; Refill: 0  Advised nasal saline and otc medication  3. Acute cough  - benzonatate (TESSALON) 100 MG capsule; Take 1 capsule (100 mg total) by mouth 2 (two) times daily as needed for cough.  Dispense: 40 capsule; Refill: 0   Advised quarantine until Sunday and when she goes back to work wear a mask for 5 days

## 2022-08-17 NOTE — Telephone Encounter (Signed)
Reason for Disposition . [1] Chest pain(s) lasting a few seconds from coughing AND [2] persists > 3 days    Has video appt today at 3:40 with Dr. Ancil Boozer  Answer Assessment - Initial Assessment Questions 1. LOCATION: "Where does it hurt?"       When take a deep breath it hurts.   It's worse at night.  My nose, headache and left ear is stopped up.    Hurts in my head, chest and nose.   Snot is a lot especially at night. I took some Theraflu.   2. RADIATION: "Does the pain go anywhere else?" (e.g., into neck, jaw, arms, back)     Left side under left breast.  I'm having terrible body aches and I'm so tired.   The mucinex is not working.   3. ONSET: "When did the chest pain begin?" (Minutes, hours or days)      Started last night the chest pain.   I've been laying in bed all day. 4. PATTERN: "Does the pain come and go, or has it been constant since it started?"  "Does it get worse with exertion?"      Pain when I cough and take a deep breath. Mucus is clear and watery.   5. DURATION: "How long does it last" (e.g., seconds, minutes, hours)     *No Answer* 6. SEVERITY: "How bad is the pain?"  (e.g., Scale 1-10; mild, moderate, or severe)    - MILD (1-3): doesn't interfere with normal activities     - MODERATE (4-7): interferes with normal activities or awakens from sleep    - SEVERE (8-10): excruciating pain, unable to do any normal activities       Pain with deep breath and occasionally with coughing 7. CARDIAC RISK FACTORS: "Do you have any history of heart problems or risk factors for heart disease?" (e.g., angina, prior heart attack; diabetes, high blood pressure, high cholesterol, smoker, or strong family history of heart disease)     Not asked 8. PULMONARY RISK FACTORS: "Do you have any history of lung disease?"  (e.g., blood clots in lung, asthma, emphysema, birth control pills)     Not asked 9. CAUSE: "What do you think is causing the chest pain?"     Chest congestion 10. OTHER  SYMPTOMS: "Do you have any other symptoms?" (e.g., dizziness, nausea, vomiting, sweating, fever, difficulty breathing, cough)       Body aches, headache, tired, lots of nasal congestion, coughing up clear watery mucus. Nose is running clear watery mucus. 11. PREGNANCY: "Is there any chance you are pregnant?" "When was your last menstrual period?"       Not asked  Protocols used: Chest Pain-A-AH

## 2022-08-18 LAB — SPECIMEN STATUS REPORT

## 2022-08-18 LAB — NOVEL CORONAVIRUS, NAA: SARS-CoV-2, NAA: NOT DETECTED

## 2022-10-31 ENCOUNTER — Encounter: Payer: Self-pay | Admitting: Physician Assistant

## 2022-10-31 ENCOUNTER — Ambulatory Visit: Payer: 59 | Admitting: Physician Assistant

## 2022-10-31 VITALS — BP 134/90 | Temp 98.4°F | Resp 16 | Ht 68.0 in | Wt 188.3 lb

## 2022-10-31 DIAGNOSIS — K625 Hemorrhage of anus and rectum: Secondary | ICD-10-CM

## 2022-10-31 DIAGNOSIS — Z30011 Encounter for initial prescription of contraceptive pills: Secondary | ICD-10-CM

## 2022-10-31 DIAGNOSIS — K644 Residual hemorrhoidal skin tags: Secondary | ICD-10-CM

## 2022-10-31 MED ORDER — NORGESTIMATE-ETH ESTRADIOL 0.25-35 MG-MCG PO TABS
1.0000 | ORAL_TABLET | Freq: Every day | ORAL | 11 refills | Status: DC
Start: 2022-10-31 — End: 2022-11-14

## 2022-10-31 NOTE — Progress Notes (Signed)
Acute Office Visit   Patient: Casey Garza   DOB: 2001-09-14   21 y.o. Female  MRN: [id number] Visit Date: 10/31/2022  Today's healthcare provider: Oswaldo Conroy Jasminne Mealy, PA-C  Introduced myself to the patient as a Secondary school teacher and provided education on APPs in clinical practice.    Chief Complaint  Patient presents with   Abdominal Pain    Lower abdomen   Rectal Bleeding    Ongoing for a week, pt noticed while wiping and can feel drips of blood after wiping   Subjective    HPI HPI     Abdominal Pain    Additional comments: Lower abdomen        Rectal Bleeding    Additional comments: Ongoing for a week, pt noticed while wiping and can feel drips of blood after wiping      Last edited by Forde Radon, CMA on 10/31/2022 10:49 AM.       Abdominal pain  Onset: sudden  Duration: started last Sunday -  Location: lower abdomen - reports pain is cramping in nature and she feels bloated  States this is not alleviated with bowel movements  Alleviating: nothing  Aggravating: nothing  Interventions: nothing OTC- has tried using a heating pad   Rectal Bleeding Onset:sudden Duration: since  last Sunday - 10/23/22 Location: Reports bright red blood after wiping following bowel movement with copious amounts of dripping blood Reports she has blood with every bowel movement since- denies pain with defecation Alleviating: nothing  Aggravating: nothing  Interventions:she has not taken anything for this    Denies straining or difficult bowel movements, reports she does not feel constipated  Reports a Bristol Stool of 4 most days  She also reports bruising on her legs - thinks they appear without known cause   Reports she would like to change from Nuvaring back to OCP States she has not had trouble on OCP in the past  Reports she is a nonsmoker, has not had previous trouble with blood clots, headaches and has not missed a dose of her Nuvaring She denies current  concerns for pregnancy   Medications: Outpatient Medications Prior to Visit  Medication Sig   [DISCONTINUED] etonogestrel-ethinyl estradiol (NUVARING) 0.12-0.015 MG/24HR vaginal ring Insert vaginally and leave in place for 3 consecutive weeks, then remove for 1 week.   acetaminophen (TYLENOL) 500 MG tablet Take 2 tablets (1,000 mg total) by mouth every 6 (six) hours as needed for fever or headache.   benzonatate (TESSALON) 100 MG capsule Take 1 capsule (100 mg total) by mouth 2 (two) times daily as needed for cough.   ondansetron (ZOFRAN-ODT) 4 MG disintegrating tablet Take 1 tablet (4 mg total) by mouth every 8 (eight) hours as needed for nausea or vomiting. (Patient not taking: Reported on 08/17/2022)   No facility-administered medications prior to visit.    Review of Systems  Constitutional:  Negative for chills, fatigue and fever.  Gastrointestinal:  Positive for abdominal pain, anal bleeding and blood in stool. Negative for constipation, diarrhea, rectal pain and vomiting.  Hematological:  Bruises/bleeds easily.       Objective    BP (!) 134/90   Temp 98.4 F (36.9 C) (Oral)   Resp 16   Ht 5\' 8"  (1.727 m)   Wt 188 lb 4.8 oz (85.4 kg)   LMP 10/05/2022 (Exact Date)   SpO2 100%   Breastfeeding No   BMI 28.63 kg/m    Physical Exam Vitals  reviewed.  Constitutional:      Appearance: She is well-developed.  HENT:     Head: Normocephalic and atraumatic.  Eyes:     Extraocular Movements: Extraocular movements intact.     Pupils: Pupils are equal, round, and reactive to light.  Cardiovascular:     Rate and Rhythm: Normal rate and regular rhythm.  Pulmonary:     Effort: Pulmonary effort is normal.  Genitourinary:    Rectum: External hemorrhoid present. No mass, tenderness, anal fissure or internal hemorrhoid. Normal anal tone.     Comments: Evidence of resolving external hemorrhoids present along with rectal skin tag DRE did not reveal palpable internal hemorrhoids or  gross blood  No evidence of external irritation, abscess, warmth, redness  Skin:    General: Skin is warm and dry.  Neurological:     General: No focal deficit present.     Mental Status: She is alert and oriented to person, place, and time.  Psychiatric:        Mood and Affect: Mood normal.        Behavior: Behavior normal.        Thought Content: Thought content normal.        Judgment: Judgment normal.       No results found for any visits on 10/31/22.  Assessment & Plan      Return in about 2 weeks (around 11/14/2022) for Abdominal pain and rectal bleeding.      Problem List Items Addressed This Visit   None Visit Diagnoses     Painless rectal bleeding    -  Primary Acute, new concern  Reports painless, bright red blood per rectum with bowel movements for the past week Suspect this is likely secondary to hemorrhoids at this time  Patient appears hemodynamically stable today- reviewed hemorrhoids and provided patient information sheets  Recommend she try treating with OTC medications such as witch hazel pads and Preparation H ointment  If not improving or becomes worse - may need to seek evaluation with GI- referral provided today to assist with establishment Recommend she keep symptom log and return if she has persistent or progressing symptoms     Relevant Orders   Ambulatory referral to Gastroenterology   Hemorrhoids, external without complications     See painless rectal bleeding for A&P management     OCP (oral contraceptive pills) initiation     Patient reports she has been consistent with using Nuvaring but would like to switch to OCP due to discomfort with Nuvaring She states she is not a smoker, has never had adverse reaction or side effects from OCPs in the past  Will provide script today  Follow up in one year or sooner if concerns arise.     Relevant Medications   norgestimate-ethinyl estradiol (SPRINTEC 28) 0.25-35 MG-MCG tablet        Return in  about 2 weeks (around 11/14/2022) for Abdominal pain and rectal bleeding.   I, Magdaline Zollars E Estevon Fluke, PA-C, have reviewed all documentation for this visit. The documentation on 10/31/22 for the exam, diagnosis, procedures, and orders are all accurate and complete.   Jacquelin Hawking, MHS, PA-C [facility]

## 2022-10-31 NOTE — Patient Instructions (Addendum)
I would like you to keep a symptom log of your abdominal pain and the bleeding   I believe that the bleeding was likely from an external hemorrhoid and should be getting better from this time onward  You can use Preparation H and Witch Hazel pads to help with hemorrhoid pain and resolution   Start the Oral birth control medication on your next scheduled Nuvaring  insertion day

## 2022-11-14 ENCOUNTER — Encounter: Payer: Self-pay | Admitting: Family Medicine

## 2022-11-14 ENCOUNTER — Ambulatory Visit (INDEPENDENT_AMBULATORY_CARE_PROVIDER_SITE_OTHER): Payer: 59 | Admitting: Family Medicine

## 2022-11-14 VITALS — BP 126/74 | HR 93 | Temp 98.2°F | Resp 16 | Ht 68.0 in | Wt 188.5 lb

## 2022-11-14 DIAGNOSIS — Z308 Encounter for other contraceptive management: Secondary | ICD-10-CM | POA: Diagnosis not present

## 2022-11-14 DIAGNOSIS — Z3045 Encounter for surveillance of transdermal patch hormonal contraceptive device: Secondary | ICD-10-CM

## 2022-11-14 MED ORDER — LO LOESTRIN FE 1 MG-10 MCG / 10 MCG PO TABS: 1.0000 | ORAL_TABLET | Freq: Every day | ORAL | 2 refills | Status: DC

## 2022-11-14 NOTE — Patient Instructions (Signed)
Please send me a message this next 1-2 weeks and let me know if you are having headaches with the different birth control pill  Preventing Pregnancy, Adult If you choose to have sex, you need to think about the chance of pregnancy every time you have sex. You can even become pregnant if you do not have a regular menstrual period or when you are breastfeeding. Using a form of birth control (contraception) that is best for you can help prevent pregnancy. Talk with your health care provider about the options available to you for preventing pregnancy. Work together to make a decision that is right for you. How can unplanned pregnancy affect me? Not taking steps to prevent pregnancy can result in one or more unintended pregnancies. When this happens, you may be faced with making a difficult decision about whether to continue the pregnancy or end (terminate) the pregnancy. An unplanned pregnancy can have an impact on your health and the health of the unborn baby. What actions can I take to prevent pregnancy? The only way to completely prevent pregnancy is not to have sex (practice abstinence). If you choose to have sex and do not want to get pregnant, you should use some type of contraception every time you have sex. Types of temporary birth control     There are many kinds of birth control. Birth control must be used exactly as told by your health care provider or as recommended by instructions on the package. You may consider: Using a long-acting, reversible form of birth control, such as: An intrauterine device, also called an IUD. An implantable or injectable hormonal birth control. Taking birth control pills by mouth (orally), using birth control patches, or using contraceptive vaginal rings. Using a condom. These are most effective when used with another form of birth control, such as birth control pills or an IUD. Condoms also help protect against STIs (sexually transmitted  infections). Practicing natural family planning (rhythm method). This is the least effective method of preventing pregnancy. This option relies on: Knowing when you are most likely to release an egg (ovulate) and be most fertile. Learning the signs of fertility and avoiding sex when you notice these signs. Signs may include increased vaginal discharge and slight changes in body temperature. To be most effective, these methods must be used exactly as told by your health care provider. If you decide that you want to become pregnant, you can stop any of these methods at any time. Emergency birth control This is to be used if you have sex without using birth control and you are concerned that you might be pregnant. You should know that: Emergency birth control can be purchased from a pharmacy without a prescription. It can prevent pregnancy if taken up to 72 hours after having unprotected sex. Emergency birth control should not be used on a regular basis. If you have questions about emergency birth control, ask your health care provider. Permanent birth control This is a surgical procedure to prevent pregnancy permanently. In this surgery, the fallopian tubes are either blocked or closed off. This prevents eggs from reaching the uterus. Where to find support You can find support at clinics and health care providers who can educate you about birth control options. Some clinics offer services whose prices vary based on financial need (sliding scale). Most clinics take health insurance. Where can I get more information? Centers for Disease Control and Prevention: FootballExhibition.com.br Office on [employer] Health: http://hoffman.com/ Department of Health and Human Services: ThisPath.fi Summary  The only completely effective way to prevent pregnancy is to avoid having sex. Preventing pregnancy depends on finding the birth control method that works best for you. No matter which type of birth control you choose, it  should be used correctly every time you have sex. Condoms, which can be used for birth control, can also protect against STIs. Unplanned pregnancy can have an impact on your health and the health of the unborn baby. Talk with your health care provider about how you can avoid unplanned pregnancy. This information is not intended to replace advice given to you by your health care provider. Make sure you discuss any questions you have with your health care provider. Document Revised: 04/20/2020 Document Reviewed: 04/20/2020 Elsevier Patient Education  2023 [employer].

## 2022-11-14 NOTE — Progress Notes (Unsigned)
Patient ID: Casey Garza, female    DOB: 09-07-2001, 21 y.o.   MRN: [id number]  PCP: Danelle Berry, PA-C  Chief Complaint  Patient presents with   Follow-up   Abdominal Pain    No longer in pain   Rectal Bleeding    No more bleeding   Contraception    New pills making pt nauseous/headaches    Subjective:   Casey Garza is a 21 y.o. female, presents to clinic with CC of the following:  HPI  Pt here for f/up on abd pain, bleeding, she saw PA Erin on 12/4 She reports all her sx have resolved, no bleeding She is on new OCP that makes her feel very bad with headaches she is trying for the last 2 weeks.  This was started as an alternative to the NuvaRing which she was previously on.  She reports no history of migraines or headaches with her menstrual cycles or family history.  She previously tried the Depo shot and had heavy bleeding. OCP she is currently taking Sprintec, she did not take her dose today but has not missed any doses She previously was on the estrogen patch and she did like that- worked well for her - was per GYN- no HA  She is looking to prevent pregnancy   Patient Active Problem List   Diagnosis Date Noted   Delivery of pregnancy by cesarean section    Severe preeclampsia, third trimester 04/22/2021   Elevated blood pressure affecting pregnancy in third trimester, antepartum 04/21/2021      Current Outpatient Medications:    norgestimate-ethinyl estradiol (SPRINTEC 28) 0.25-35 MG-MCG tablet, Take 1 tablet by mouth daily., Disp: 28 tablet, Rfl: 11   Allergies  Allergen Reactions   Bactrim [Sulfamethoxazole-Trimethoprim] Rash    Rash, dizzy, nausea     Social History   Tobacco Use   Smoking status: Never   Smokeless tobacco: Never  Vaping Use   Vaping Use: Former  Substance Use Topics   Alcohol use: No   Drug use: Not Currently      Chart Review Today: I personally reviewed active problem list, medication list, allergies, family history,  social history, health maintenance, notes from last encounter, lab results, imaging with the patient/caregiver today.   Review of Systems  Constitutional: Negative.   HENT: Negative.    Eyes: Negative.   Respiratory: Negative.    Cardiovascular: Negative.   Gastrointestinal: Negative.   Endocrine: Negative.   Genitourinary: Negative.   Musculoskeletal: Negative.   Skin: Negative.   Allergic/Immunologic: Negative.   Neurological: Negative.   Hematological: Negative.   Psychiatric/Behavioral: Negative.    All other systems reviewed and are negative.      Objective:   Vitals:   11/14/22 0912  BP: 126/74  Pulse: 93  Resp: 16  Temp: 98.2 F (36.8 C)  TempSrc: Oral  SpO2: 99%  Weight: 188 lb 8 oz (85.5 kg)  Height: 5\' 8"  (1.727 m)    Body mass index is 28.66 kg/m.  Physical Exam Vitals and nursing note reviewed.  Constitutional:      General: She is not in acute distress.    Appearance: Normal appearance. She is well-developed. She is not ill-appearing, toxic-appearing or diaphoretic.  HENT:     Head: Normocephalic and atraumatic.     Nose: Nose normal.  Eyes:     General:        Right eye: No discharge.        Left eye: No  discharge.     Conjunctiva/sclera: Conjunctivae normal.  Neck:     Trachea: No tracheal deviation.  Cardiovascular:     Rate and Rhythm: Normal rate and regular rhythm.     Pulses: Normal pulses.     Heart sounds: Normal heart sounds.  Pulmonary:     Effort: Pulmonary effort is normal. No respiratory distress.     Breath sounds: Normal breath sounds. No stridor.  Abdominal:     General: Abdomen is flat. There is no distension.     Palpations: Abdomen is soft.     Tenderness: There is no abdominal tenderness. There is no guarding or rebound.  Musculoskeletal:        General: Normal range of motion.  Skin:    General: Skin is warm and dry.     Findings: No rash.  Neurological:     Mental Status: She is alert.     Cranial Nerves: No  cranial nerve deficit, dysarthria or facial asymmetry.     Motor: No abnormal muscle tone.     Coordination: Coordination is intact. Coordination normal.     Gait: Gait is intact.  Psychiatric:        Mood and Affect: Mood normal.        Behavior: Behavior normal.      Results for orders placed or performed in visit on 08/17/22  Novel Coronavirus, NAA ([facility])   Specimen: Nasopharyngeal(NP) swabs in vial transport medium   Nasopharynge  Previous  Result Value Ref Range   SARS-CoV-2, NAA Not Detected Not Detected  Specimen status report  Result Value Ref Range   specimen status report Comment        Assessment & Plan:   Pt here for a 2 week follow up on acute issues that have all resolved She was started on OCP that she is having bothersome daily SE with - Has with sprintec She is also worried about her BP - BP today is in normal range - her HA is likely from the pill and not from her BP    ICD-10-CM   1. Encounter for other contraceptive management  Z30.8    SE with sprintec, reviewed lower dose OCP options, loloestrin, pt going to think about it, may want to try the patch again, BP good     LoLoestrin sent in - I did 1 pack, encouraged her to try it for a week if she thinks she wants to stay on the pill  She messaged me later in the day saying she decided that the patch would be a better option for her - reviewed her chart and past meds more and then I sent in the patch and d/c the lo loestrin  Encouraged the pt to f/up in a few months to recheck sx, bp, ocp and do her routine f/up and HM   Return for 3-6 months CPE.   Danelle Berry, PA-C 11/14/22 9:38 AM

## 2022-11-15 MED ORDER — XULANE 150-35 MCG/24HR TD PTWK: MEDICATED_PATCH | TRANSDERMAL | 3 refills | Status: DC

## 2022-12-02 ENCOUNTER — Encounter: Payer: Self-pay | Admitting: Family Medicine

## 2022-12-06 ENCOUNTER — Ambulatory Visit (INDEPENDENT_AMBULATORY_CARE_PROVIDER_SITE_OTHER): Payer: 59 | Admitting: Physician Assistant

## 2022-12-06 ENCOUNTER — Encounter: Payer: Self-pay | Admitting: Physician Assistant

## 2022-12-06 VITALS — BP 130/80 | HR 94 | Temp 98.1°F | Resp 16 | Ht 68.0 in | Wt 188.7 lb

## 2022-12-06 DIAGNOSIS — K644 Residual hemorrhoidal skin tags: Secondary | ICD-10-CM

## 2022-12-06 NOTE — Progress Notes (Signed)
Acute Office Visit   Patient: Casey Garza   DOB: Jul 31, 2001   22 y.o. Female  MRN: [id number] Visit Date: 12/06/2022  Today's healthcare provider: Dani Gobble [name], PA-C  Introduced myself to the patient as a Journalist, newspaper and provided education on APPs in clinical practice.    Chief Complaint  Patient presents with   Mass    Pt states felt a lump and believes its a hemorrhoid in between her glutes. It was itching last week but not much anymore   Subjective    HPI HPI     Mass    Additional comments: Pt states felt a lump and believes its a hemorrhoid in between her glutes. It was itching last week but not much anymore      Last edited by Casey Garza, CMA on 12/06/2022  8:17 AM.      Perianal lump Onset: sudden  Duration: last week  Location: thinks it is along left side of rectum Associated symptoms: itching in rectal area,  Denies blood with bowel movements or painful bowel movements She denies previous hemorrhoids to her knowledge  Interventions: none       Medications: Outpatient Medications Prior to Visit  Medication Sig   norelgestromin-ethinyl estradiol Casey Garza) 150-35 MCG/24HR transdermal patch APPLY 1 PATCH ONCE A WEEK   No facility-administered medications prior to visit.    Review of Systems  Gastrointestinal:  Negative for abdominal distention, anal bleeding, blood in stool, constipation and rectal pain.       Objective    BP 130/80   Pulse 94   Temp 98.1 F (36.7 C) (Oral)   Resp 16   Ht 5\' 8"  (1.727 m)   Wt 188 lb 11.2 oz (85.6 kg)   SpO2 97%   Breastfeeding No   BMI 28.69 kg/m    Physical Exam Vitals reviewed.  Constitutional:      General: She is awake.     Appearance: Normal appearance. She is well-developed and well-groomed.  HENT:     Head: Normocephalic and atraumatic.  Pulmonary:     Effort: Pulmonary effort is normal.  Genitourinary:    Rectum: External hemorrhoid present. No tenderness or anal fissure.      Comments: External hemorrhoid palpated along left inferior rectal border  No evidence of thrombosis or inflammation at this time Skin tag present in roughly 6 o'clock position of rectum   Skin:    General: Skin is warm.  Neurological:     General: No focal deficit present.     Mental Status: She is alert and oriented to person, place, and time. Mental status is at baseline.  Psychiatric:        Mood and Affect: Mood normal.        Behavior: Behavior normal. Behavior is cooperative.        Thought Content: Thought content normal.        Judgment: Judgment normal.       No results found for any visits on 12/06/22.  Assessment & Plan      No follow-ups on file.      Problem List Items Addressed This Visit   None Visit Diagnoses     Hemorrhoids, external without complications    -  Primary Acute on chronic, ongoing Reviewed findings of PE with patient and discussed hemorrhoids along with home measures to assist with improvement Discussed fiber intake,restroom habits, exercise to help prevent worsening symptoms Discussed using Preparation H  and sitz baths for relief Spent approx 8 minutes of apt on patient education and reassurance  Follow up as needed for persistent or progressing symptoms         No follow-ups on file.   I, Casey Boeve E Khale Nigh, PA-C, have reviewed all documentation for this visit. The documentation on 12/06/22 for the exam, diagnosis, procedures, and orders are all accurate and complete.   Casey Garza, MHS, PA-C [facility]

## 2022-12-15 IMAGING — US US OB COMP +14 WK
1 series · 12 of 28 positions shown · non-contrast
Comparison: none

Addendum:
CLINICAL DATA: Pregnancy. Fetal growth determination. AFI
determination.

EXAM:
OBSTETRICAL ULTRASOUND >14 WKS

[Series 1: us ob comp + 14 wk · 12 of 76 slices shown]
[im 3/76]
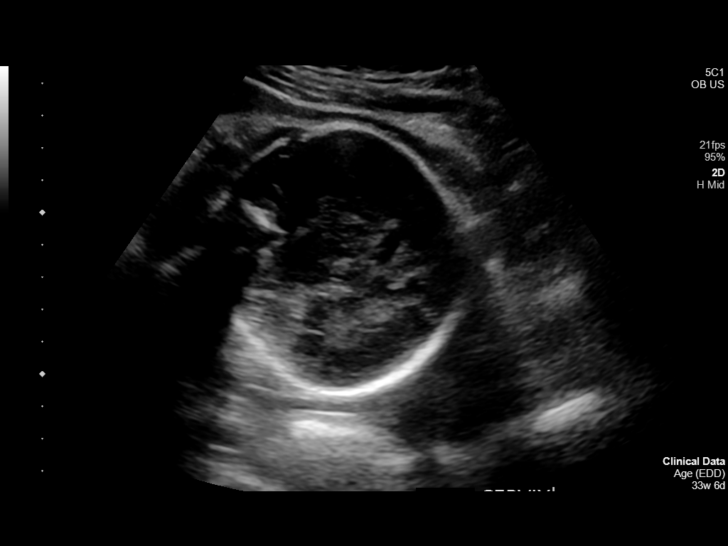
[im 9/76]
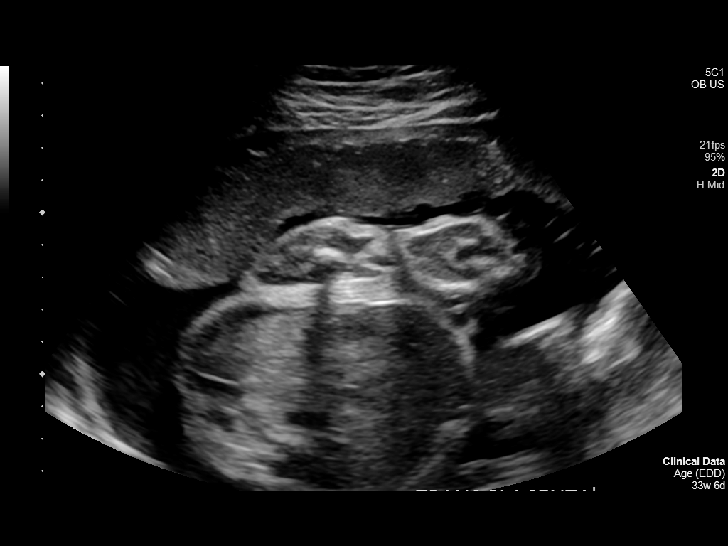
[im 14/76]
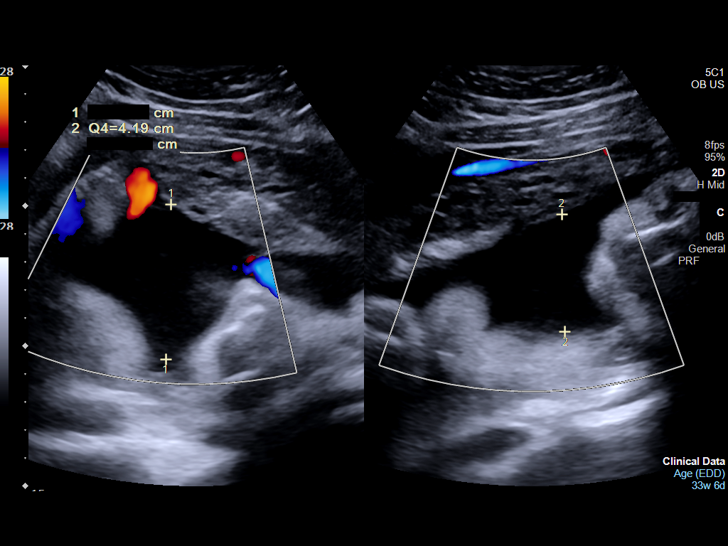
[im 23/76]
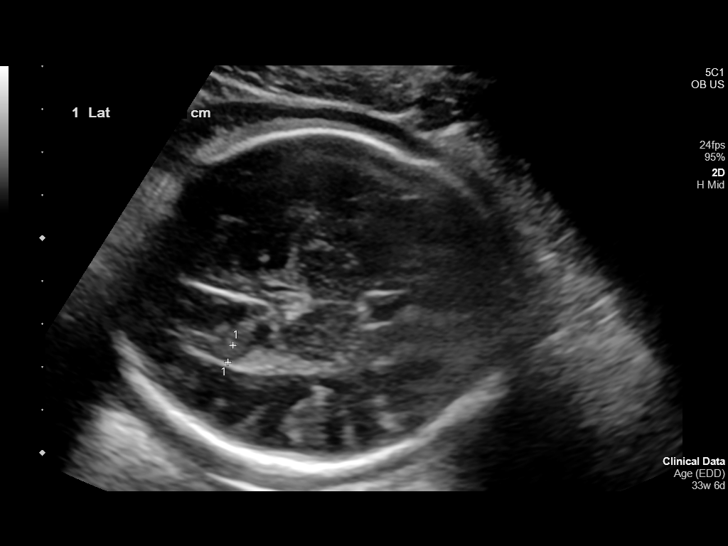
[im 28/76]
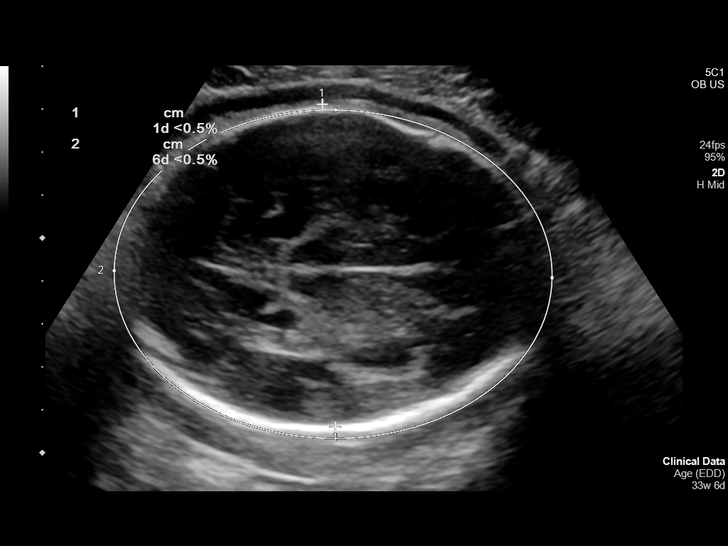
[im 34/76]
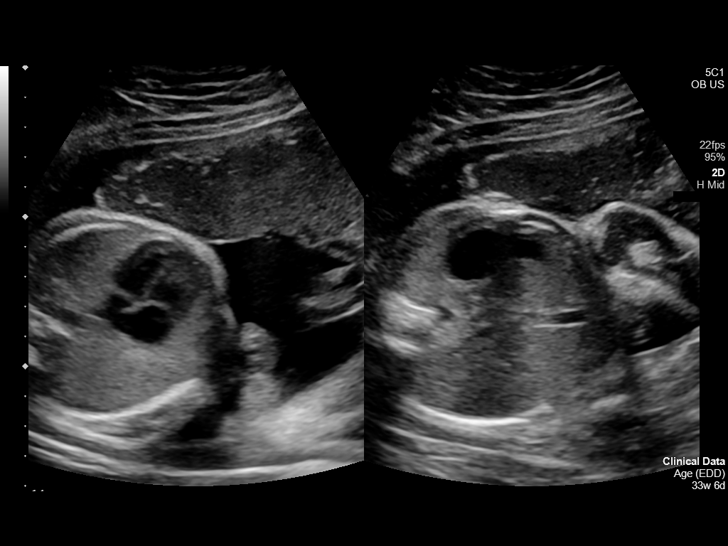
[im 42/76]
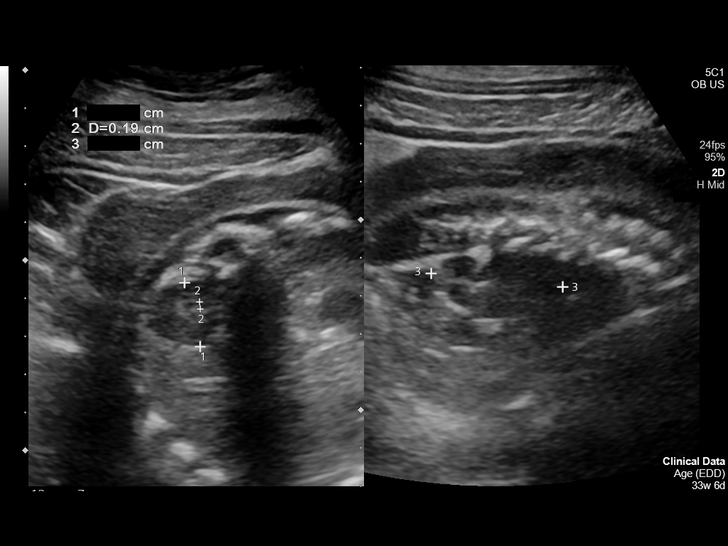
[im 48/76]
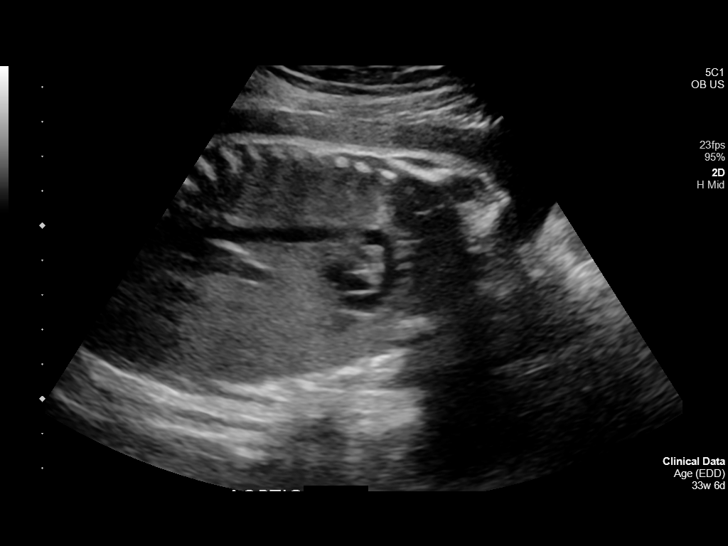
[im 53/76]
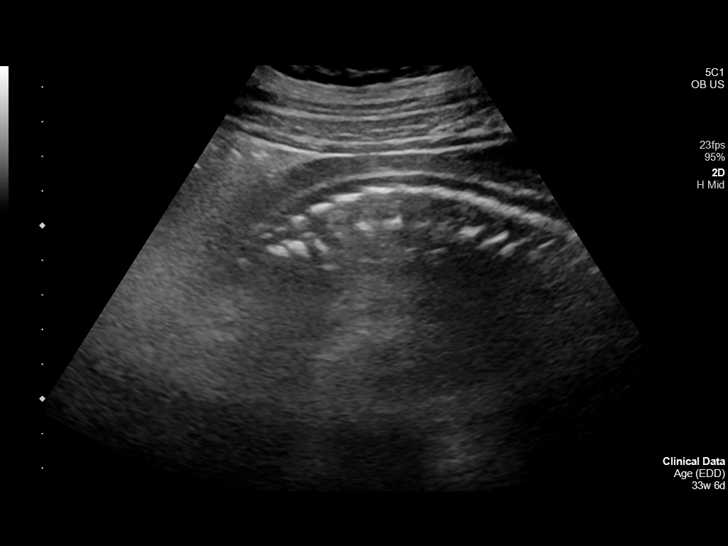
[im 62/76]
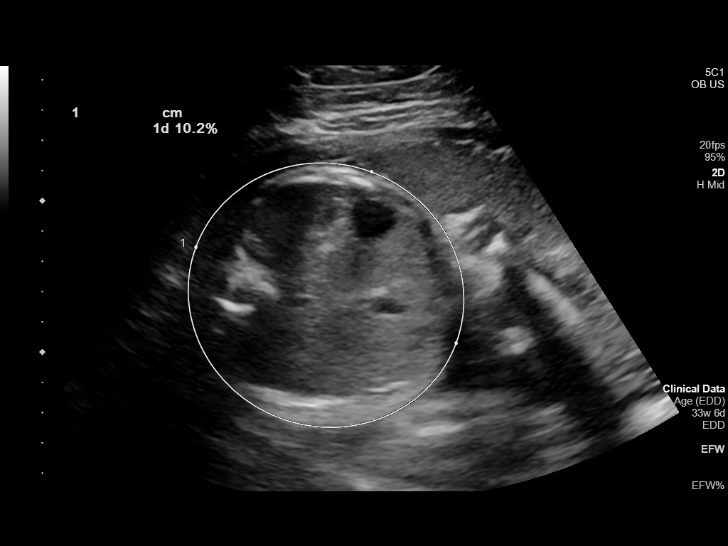
[im 67/76]
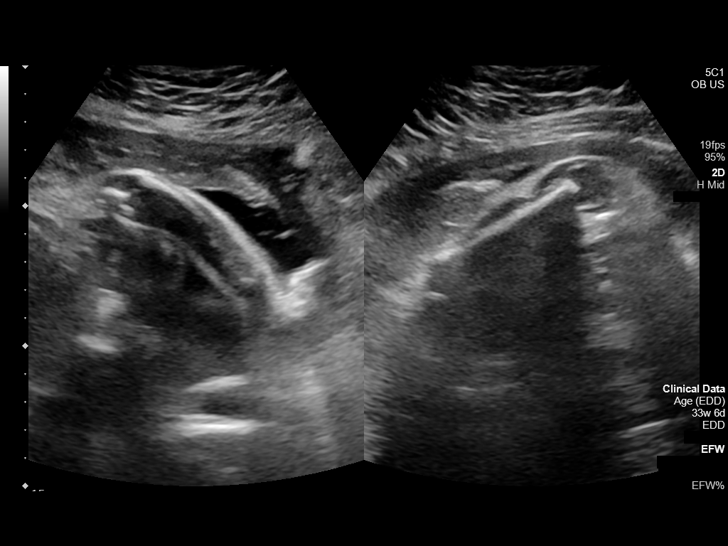
[im 73/76]
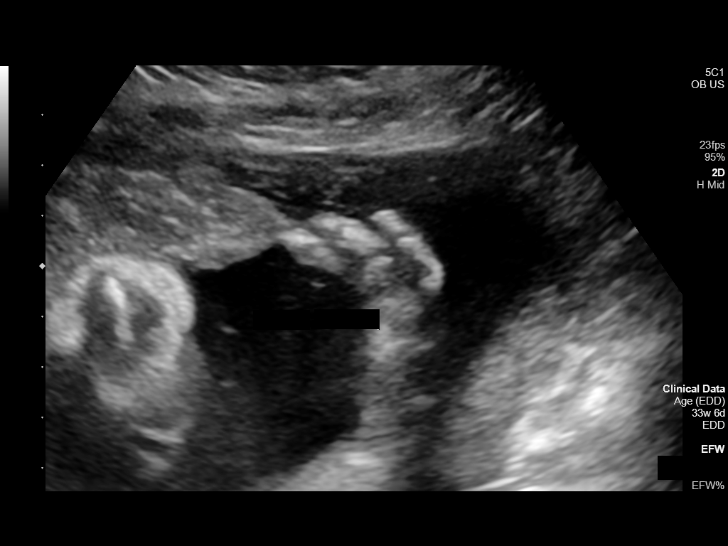

[12 of 28 positions shown; findings below may reference images not displayed]

FINDINGS: Number of Fetuses: 1

Heart Rate:  155 bpm

Movement: Present

Presentation: Cephalic

Previa: No

Placental Location: Anterior

Amniotic Fluid (Subjective): Normal

Amniotic Fluid (Objective):

Vertical pocket = 5.5cm

AFI = 17.5 cm (5%ile= 8.1 cm, 95%= 24.8 cm for 34 wks)

FETAL BIOMETRY

BPD: 7.5cm 30w 1d

HC:   28.1cm 30w 5d

AC:   28.0cm 32w 0d

FL:   6.4cm 32w 6d

Current Mean GA: 31w 6d US EDC: 06/16/2021

Assigned GA:  33w 6d Assigned EDC: 06/03/2021

Estimated Fetal Weight:  1878.6g 5.8%ile

FETAL ANATOMY

Lateral Ventricles: Appears normal

Thalami/CSP: Appears normal

Posterior Fossa:  Not visualized

Nuchal Region: Not visualized

Upper Lip: Appears normal

Spine: Appears normal

4 Chamber Heart on Left: Appears normal

LVOT: Appears normal

RVOT: Appears normal

Stomach on Left: Appears normal

3 Vessel Cord: Appears normal

Cord Insertion site: Not visualized

Kidneys: Appears normal

Bladder: Appears normal

Extremities: Appears normal

Technically difficult due to: Fetal position and advanced
gestational age

Maternal Findings:

Cervix:  3.4 cm and closed
IMPRESSION: 1. Single viable intrauterine pregnancy at 31 weeks 6 days. Cephalic
presentation. Fetal heart rate 155 beats per minute. Estimated fetal
weight is 1878.6 g which is 5.8 percentile. This suggest IUGR.

2.  AFI of 17.5.

3. Fetal posterior fossa and nuchal region not visualized. Follow-up
exam should be considered.

ADDENDUM:
These results will be called to the ordering clinician or
representative by the Radiologist Assistant, and communication
documented in the PACS or [REDACTED].

*** End of Addendum ***
FINDINGS: Number of Fetuses: 1

Heart Rate:  155 bpm

Movement: Present

Presentation: Cephalic

Previa: No

Placental Location: Anterior

Amniotic Fluid (Subjective): Normal

Amniotic Fluid (Objective):

Vertical pocket = 5.5cm

AFI = 17.5 cm (5%ile= 8.1 cm, 95%= 24.8 cm for 34 wks)

FETAL BIOMETRY

BPD: 7.5cm 30w 1d

HC:   28.1cm 30w 5d

AC:   28.0cm 32w 0d

FL:   6.4cm 32w 6d

Current Mean GA: 31w 6d US EDC: 06/16/2021

Assigned GA:  33w 6d Assigned EDC: 06/03/2021

Estimated Fetal Weight:  1878.6g 5.8%ile

FETAL ANATOMY

Lateral Ventricles: Appears normal

Thalami/CSP: Appears normal

Posterior Fossa:  Not visualized

Nuchal Region: Not visualized

Upper Lip: Appears normal

Spine: Appears normal

4 Chamber Heart on Left: Appears normal

LVOT: Appears normal

RVOT: Appears normal

Stomach on Left: Appears normal

3 Vessel Cord: Appears normal

Cord Insertion site: Not visualized

Kidneys: Appears normal

Bladder: Appears normal

Extremities: Appears normal

Technically difficult due to: Fetal position and advanced
gestational age

Maternal Findings:

Cervix:  3.4 cm and closed
IMPRESSION: 1. Single viable intrauterine pregnancy at 31 weeks 6 days. Cephalic
presentation. Fetal heart rate 155 beats per minute. Estimated fetal
weight is 1878.6 g which is 5.8 percentile. This suggest IUGR.

2.  AFI of 17.5.

3. Fetal posterior fossa and nuchal region not visualized. Follow-up
exam should be considered.

## 2023-01-05 DIAGNOSIS — K625 Hemorrhage of anus and rectum: Secondary | ICD-10-CM | POA: Diagnosis not present

## 2023-01-05 DIAGNOSIS — K649 Unspecified hemorrhoids: Secondary | ICD-10-CM | POA: Diagnosis not present

## 2023-02-02 DIAGNOSIS — J069 Acute upper respiratory infection, unspecified: Secondary | ICD-10-CM | POA: Diagnosis not present

## 2023-02-02 DIAGNOSIS — Z20822 Contact with and (suspected) exposure to covid-19: Secondary | ICD-10-CM | POA: Diagnosis not present

## 2023-02-02 DIAGNOSIS — R059 Cough, unspecified: Secondary | ICD-10-CM | POA: Diagnosis not present

## 2023-02-05 ENCOUNTER — Encounter: Payer: Self-pay | Admitting: Family Medicine

## 2023-02-20 ENCOUNTER — Ambulatory Visit (INDEPENDENT_AMBULATORY_CARE_PROVIDER_SITE_OTHER): Payer: 59 | Admitting: Family Medicine

## 2023-02-20 ENCOUNTER — Other Ambulatory Visit (HOSPITAL_COMMUNITY)
Admission: RE | Admit: 2023-02-20 | Discharge: 2023-02-20 | Disposition: A | Discharge status: Home / Self Care | Payer: 59 | Source: Ambulatory Visit | Attending: Family Medicine | Admitting: Family Medicine

## 2023-02-20 VITALS — BP 122/80 | HR 76 | Temp 98.4°F | Resp 16 | Ht 68.0 in | Wt 183.3 lb

## 2023-02-20 DIAGNOSIS — N898 Other specified noninflammatory disorders of vagina: Secondary | ICD-10-CM

## 2023-02-20 DIAGNOSIS — Z789 Other specified health status: Secondary | ICD-10-CM | POA: Diagnosis not present

## 2023-02-20 DIAGNOSIS — L905 Scar conditions and fibrosis of skin: Secondary | ICD-10-CM

## 2023-02-20 DIAGNOSIS — Z113 Encounter for screening for infections with a predominantly sexual mode of transmission: Secondary | ICD-10-CM

## 2023-02-20 NOTE — Progress Notes (Signed)
Patient ID: Casey Garza, female    DOB: [birth year], 22 y.o.   MRN: [id number]  PCP: Delsa Grana, PA-C  Chief Complaint  Patient presents with   vagina odor    Onset for weeks pt states discharge is white/clear can look like cottage cheese    Subjective:   Casey Garza is a 22 y.o. female, presents to clinic with CC of the following:  HPI  Here for eval on vaginal odor, she has intermittent discharge clear to white, no vaginal itching/irritation, no pain with sex She is doing boric acid, is looking for a probiotic to try, she also has tried a sensitive skin womans wash (external - acid based wash) and she states that odor always returns - she notices it sometimes intermittently throughout the day or also when about to be sexually active no matter how "clean" she tries to be Also recently restarted birth control patch after trying PCO pills with HA and too many SE - doing well, no SE She also notes she sweats a lot in groin area, no other issues with body odor (axilla)   Female partner- one partner for years, no suspected exposure to STDs or other partners   She also asks about her c-section scar - which is sore, burns sometimes, is raised and large   Patient Active Problem List   Diagnosis Date Noted   Delivery of pregnancy by cesarean section    Severe preeclampsia, third trimester 04/22/2021   Elevated blood pressure affecting pregnancy in third trimester, antepartum 04/21/2021      Current Outpatient Medications:    amoxicillin-clavulanate (AUGMENTIN) 875-125 MG tablet, SMARTSIG:1 Tablet(s) By Mouth Every 12 Hours, Disp: , Rfl:    norelgestromin-ethinyl estradiol [name]) 150-35 MCG/24HR transdermal patch, APPLY 1 PATCH ONCE A WEEK, Disp: 3 patch, Rfl: 3   Allergies  Allergen Reactions   Bactrim [Sulfamethoxazole-Trimethoprim] Rash    Rash, dizzy, nausea     Social History   Tobacco Use   Smoking status: Never   Smokeless tobacco: Never  Vaping Use   Vaping  Use: Former  Substance Use Topics   Alcohol use: No   Drug use: Not Currently      Chart Review Today: I personally reviewed active problem list, medication list, allergies, family history, social history, health maintenance, notes from last encounter, lab results, imaging with the patient/caregiver today.    Review of Systems  Constitutional: Negative.   HENT: Negative.    Eyes: Negative.   Respiratory: Negative.    Cardiovascular: Negative.   Gastrointestinal: Negative.   Endocrine: Negative.   Genitourinary: Negative.   Musculoskeletal: Negative.   Skin: Negative.   Allergic/Immunologic: Negative.   Neurological: Negative.   Hematological: Negative.   Psychiatric/Behavioral: Negative.    All other systems reviewed and are negative.      Objective:   Vitals:   02/20/23 1036  BP: 122/80  Pulse: 76  Resp: 16  Temp: 98.4 F (36.9 C)  TempSrc: Oral  SpO2: 100%  Weight: 183 lb 4.8 oz (83.1 kg)  Height: 5\' 8"  (1.727 m)    Body mass index is 27.87 kg/m.  Physical Exam Vitals and nursing note reviewed.  Constitutional:      General: She is not in acute distress.    Appearance: Normal appearance. She is well-developed. She is not ill-appearing, toxic-appearing or diaphoretic.  HENT:     Head: Normocephalic and atraumatic.     Right Ear: External ear normal.     Left  Ear: External ear normal.     Nose: Nose normal.  Eyes:     General:        Right eye: No discharge.        Left eye: No discharge.     Conjunctiva/sclera: Conjunctivae normal.  Neck:     Trachea: No tracheal deviation.  Cardiovascular:     Rate and Rhythm: Normal rate and regular rhythm.  Pulmonary:     Effort: Pulmonary effort is normal. No respiratory distress.     Breath sounds: No stridor.  Abdominal:     General: Abdomen is flat.     Comments: Low transverse scar appears keloid - raised, rounded, shiny  Musculoskeletal:        General: Normal range of motion.  Skin:    General:  Skin is warm and dry.     Findings: No rash.  Neurological:     Mental Status: She is alert.     Motor: No abnormal muscle tone.     Coordination: Coordination normal.  Psychiatric:        Behavior: Behavior normal.      Results for orders placed or performed in visit on 08/17/22  Novel Coronavirus, NAA ([facility])   Specimen: Nasopharyngeal(NP) swabs in vial transport medium   Nasopharynge  Previous  Result Value Ref Range   SARS-CoV-2, NAA Not Detected Not Detected  Specimen status report  Result Value Ref Range   specimen status report Comment        Assessment & Plan:     ICD-10-CM   1. Vaginal odor  N89.8 Cervicovaginal ancillary only    Ambulatory referral to Obstetrics / Gynecology   discussed boric acid tx, probiotics, trial of products like lume, airing out at night, vaginal swab today to test for yeast/BV    2. Scar irritation  L90.5 Ambulatory referral to Obstetrics / Gynecology   appears keloid  - no hx of other keloid/scar issues, pt has multiple piercings, she has done massage, mederma, its tender - f/up w GYN    3. Screening for STD (sexually transmitted disease)  Z11.3    due for annual STD screen, low risk, no pain with sex, tested today with swab for other sx/concerns, one partner for years, declined blood testing    4. Uses hormonal contraceptive patch as primary birth control method  Z78.9    tolerance and sx much better with getting back on the patch      If cytology is positive we will treat any finding - recommended pt f/up with GYN if she does not have resolution of her sx, and she would like to f/up with them for recheck of c-section scar - referral put in to get her reestablished.    Delsa Grana, PA-C 02/20/23 11:00 AM

## 2023-02-21 LAB — CERVICOVAGINAL ANCILLARY ONLY
Bacterial Vaginitis (gardnerella): POSITIVE — AB
Candida Glabrata: NEGATIVE
Candida Vaginitis: NEGATIVE
Chlamydia: NEGATIVE
Comment: NEGATIVE
Comment: NEGATIVE
Comment: NEGATIVE
Comment: NEGATIVE
Comment: NEGATIVE
Comment: NORMAL
Neisseria Gonorrhea: NEGATIVE
Trichomonas: NEGATIVE

## 2023-02-23 MED ORDER — METRONIDAZOLE 500 MG PO TABS: 500.0000 mg | ORAL_TABLET | Freq: Two times a day (BID) | ORAL | 0 refills | Status: AC

## 2023-02-23 NOTE — Addendum Note (Signed)
Addended by: Delsa Grana on: 02/23/2023 04:32 PM   Modules accepted: Orders

## 2023-03-07 ENCOUNTER — Other Ambulatory Visit: Payer: Self-pay | Admitting: Family Medicine

## 2023-03-07 DIAGNOSIS — Z3045 Encounter for surveillance of transdermal patch hormonal contraceptive device: Secondary | ICD-10-CM

## 2023-03-14 ENCOUNTER — Encounter: Payer: Self-pay | Admitting: Obstetrics & Gynecology

## 2023-03-14 ENCOUNTER — Ambulatory Visit: Payer: 59 | Admitting: Obstetrics & Gynecology

## 2023-03-14 VITALS — BP 126/89 | HR 93 | Ht 67.0 in | Wt 182.0 lb

## 2023-03-14 DIAGNOSIS — L910 Hypertrophic scar: Secondary | ICD-10-CM

## 2023-03-14 DIAGNOSIS — B9689 Other specified bacterial agents as the cause of diseases classified elsewhere: Secondary | ICD-10-CM

## 2023-03-14 DIAGNOSIS — N760 Acute vaginitis: Secondary | ICD-10-CM

## 2023-03-14 MED ORDER — NUVESSA 1.3 % VA GEL: VAGINAL | 4 refills | Status: DC

## 2023-03-14 NOTE — Progress Notes (Signed)
    GYNECOLOGY PROGRESS NOTE  Subjective:    Patient ID: Casey Garza, female    DOB: [birth year], 22 y.o.   MRN: [id number]  HPI  Patient is a 22 y.o. G90P0101 (22 yo)   who presents for keloid of her cesarean section scar and recurrent bv. She has tried boric acid, but has not used it consisently. She has recently been treated by her primary care with oral flagyl and is not currently with symptoms.   The following portions of the patient's history were reviewed and updated as appropriate: allergies, current medications, past family history, past medical history, past social history, past surgical history, and problem list.  Review of Systems Pertinent items are noted in HPI.  Pap was normal in 2022. Uses the patch for contraception. She is in a monogamous relationship.  Objective:   Blood pressure 126/89, pulse 93, height  (1.702 m), weight 182 lb (82.6 kg), last menstrual period 03/09/2023, not currently breastfeeding. Body mass index is 28.51 kg/m. Well nourished, well hydrated Black female, no apparent distress She is ambulating and conversing normally. 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 We have discussed that prevention is better than cure, so I have rec'd at least once per week use of boric acid  Keloid- I have placed a referral to derm and warned her that there may be a wait to be seen.  Plan:   There are no diagnoses linked to this encounter.

## 2023-05-02 DIAGNOSIS — W57XXXA Bitten or stung by nonvenomous insect and other nonvenomous arthropods, initial encounter: Secondary | ICD-10-CM | POA: Diagnosis not present

## 2023-05-02 DIAGNOSIS — S30861A Insect bite (nonvenomous) of abdominal wall, initial encounter: Secondary | ICD-10-CM | POA: Diagnosis not present

## 2023-05-23 ENCOUNTER — Encounter: Payer: Self-pay | Admitting: Physician Assistant

## 2023-05-23 ENCOUNTER — Ambulatory Visit (INDEPENDENT_AMBULATORY_CARE_PROVIDER_SITE_OTHER): Payer: 59 | Admitting: Physician Assistant

## 2023-05-23 VITALS — BP 118/64 | HR 95 | Temp 98.2°F | Resp 16 | Ht 68.0 in | Wt 182.9 lb

## 2023-05-23 DIAGNOSIS — M5442 Lumbago with sciatica, left side: Secondary | ICD-10-CM | POA: Diagnosis not present

## 2023-05-23 MED ORDER — MELOXICAM 15 MG PO TABS: 15.0000 mg | ORAL_TABLET | Freq: Every day | ORAL | 0 refills | Status: DC

## 2023-05-23 MED ORDER — PREDNISONE 20 MG PO TABS: ORAL_TABLET | ORAL | 0 refills | Status: DC

## 2023-05-23 NOTE — Patient Instructions (Addendum)
I have sent in a script for an NSAID called Meloxicam - use this instead of Ibuprofen or other over the counter NSAIDs. You can continue to use Tylenol for further pain management  I have sent in a script for a Prednisone taper. Please start this tomorrow morning as it can cause sleeplessness if taken late in the day. Steroids can cause increased hunger and irritability so please be mindful of this while taking it.  I have included exercises for you to do to help with the pain and irritation. These should not be excruciatingly painful but may be uncomfortable   You can use warm compresses to the area (20 minutes on, 30 minutes off) to help with inflammation  If these measures are not leading to resolution in the next 2-4 weeks please let us know and we can try to refer you to physical therapy for further evaluation and management  It was nice to meet you and I appreciate the opportunity to be involved in your care If you were satisfied with the care you received from me, I would greatly appreciate you saying so in the after-visit survey that is sent out following our visit.

## 2023-05-23 NOTE — Progress Notes (Unsigned)
Acute Office Visit   Patient: Casey Garza   DOB: February 15, 2001   21 y.o. Female  MRN: [id number] Visit Date: 05/23/2023  Today's healthcare provider: Oswaldo Conroy Kaleb Linquist, PA-C  Introduced myself to the patient as a Secondary school teacher and provided education on APPs in clinical practice.    Chief Complaint  Patient presents with   Back Pain    Radiates down left butt into leg   Subjective    HPI HPI     Back Pain    Additional comments: Radiates down left butt into leg      Last edited by Marcos Eke, CMA on 05/23/2023 11:36 AM.       Low back pain   Onset: sudden  Duration: ongoing for 2 days  Location: lower left back with pain in buttocks  Radiation: reports pain radiates all the way down her left leg and radiates further up into her back  She denies recent injury, heavy lifting, trauma  Pain level and character: 10/10 throbbing, stabbing in buttocks area  Other associated symptoms: she reports some weakness in her left leg along with numbness  Interventions: Ibuprofen, Tylenol extra strength  Alleviating: nothing  Aggravating: standing and using her leg, bending down from the waist   She reports numbness down the side of her left leg especially if she lays on that side  She denies incontinence, saddle anesthesia  She denies recent increase to physical activity. 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    Review of Systems  Musculoskeletal:  Positive for back pain, gait problem and myalgias.  Neurological:   Positive for weakness and numbness.    {Labs  Heme  Chem  Endocrine  Serology  Results Review (optional):23779}   Objective    BP 118/64   Pulse 95   Temp 98.2 F (36.8 C)   Resp 16   Ht 5\' 8"  (1.727 m)   Wt 182 lb 14.4 oz (83 kg)   LMP 04/27/2023   SpO2 98%   BMI 27.81 kg/m  {Show previous vital signs (optional):23777}  Physical Exam Vitals reviewed.  Constitutional:      General: She is awake.     Appearance: Normal appearance. She is well-developed and well-groomed.  HENT:     Head: Normocephalic and atraumatic.  Pulmonary:     Effort: Pulmonary effort is normal.  Musculoskeletal:     Comments: Normal lower Rom and strength   Neurological:     Mental Status: She is alert.  Psychiatric:        Behavior: Behavior is cooperative.       No results found for any visits on 05/23/23.  Assessment & Plan      No follow-ups on file.       Problem List Items Addressed This Visit   None Visit Diagnoses     Acute left-sided low back pain with left-sided sciatica    -  Primary   Relevant Medications   predniSONE (DELTASONE) 20 MG tablet  meloxicam (MOBIC) 15 MG tablet        No follow-ups on file.   I, Claudia Greenley E Kashonda Sarkisyan, PA-C, have reviewed all documentation for this visit. The documentation on 05/23/23 for the exam, diagnosis, procedures, and orders are all accurate and complete.   Jacquelin Hawking, MHS, PA-C [facility]

## 2023-05-25 ENCOUNTER — Encounter: Payer: Self-pay | Admitting: Physician Assistant

## 2023-05-26 ENCOUNTER — Ambulatory Visit: Payer: 59 | Admitting: Family Medicine

## 2023-06-21 ENCOUNTER — Ambulatory Visit: Payer: 59 | Admitting: Family Medicine

## 2023-06-26 ENCOUNTER — Ambulatory Visit (INDEPENDENT_AMBULATORY_CARE_PROVIDER_SITE_OTHER): Payer: 59 | Admitting: Family Medicine

## 2023-06-26 ENCOUNTER — Encounter: Payer: Self-pay | Admitting: Family Medicine

## 2023-06-26 VITALS — BP 116/78 | HR 96 | Temp 98.1°F | Resp 16 | Ht 68.0 in | Wt 182.1 lb

## 2023-06-26 DIAGNOSIS — T148XXA Other injury of unspecified body region, initial encounter: Secondary | ICD-10-CM

## 2023-06-26 DIAGNOSIS — Z5181 Encounter for therapeutic drug level monitoring: Secondary | ICD-10-CM | POA: Diagnosis not present

## 2023-06-26 LAB — CBC WITH DIFFERENTIAL/PLATELET
Absolute Monocytes: 288 cells/uL (ref 200–950)
Basophils Absolute: 30 cells/uL (ref 0–200)
Basophils Relative: 0.5 %
Eosinophils Absolute: 30 cells/uL (ref 15–500)
Eosinophils Relative: 0.5 %
HCT: 37 % (ref 35.0–45.0)
Hemoglobin: 12.8 g/dL (ref 11.7–15.5)
Lymphs Abs: 2088 cells/uL (ref 850–3900)
MCH: 34 pg — ABNORMAL HIGH (ref 27.0–33.0)
MCHC: 34.6 g/dL (ref 32.0–36.0)
MCV: 98.1 fL (ref 80.0–100.0)
MPV: 11.8 fL (ref 7.5–12.5)
Monocytes Relative: 4.8 %
Neutro Abs: 3564 cells/uL (ref 1500–7800)
Neutrophils Relative %: 59.4 %
Platelets: 284 10*3/uL (ref 140–400)
RBC: 3.77 10*6/uL — ABNORMAL LOW (ref 3.80–5.10)
RDW: 11.9 % (ref 11.0–15.0)
Total Lymphocyte: 34.8 %
WBC: 6 10*3/uL (ref 3.8–10.8)

## 2023-06-26 NOTE — Progress Notes (Signed)
Patient ID: TUYEN DOONER, female    DOB: [birth year], 22 y.o.   MRN: [id number]  PCP: Danelle Berry, PA-C  Chief Complaint  Patient presents with   Bleeding/Bruising    Onset for several months, bruises come and go. They do not bother patient    Subjective:   Casey Garza is a 22 y.o. female, presents to clinic with CC of the following:  HPI  Bruising f/up Bruising to the back of her legs no injury Has been ongoing for weeks   No NSAIDs  No other bleeding symptoms including no dysuria, blood in stool, nosebleeds, blood when brushing teeth No petechia    Patient Active Problem List   Diagnosis Date Noted   Delivery of pregnancy by cesarean section    Severe preeclampsia, third trimester 04/22/2021   Elevated blood pressure affecting pregnancy in third trimester, antepartum 04/21/2021      Current Outpatient Medications:    norelgestromin-ethinyl estradiol (XULANE) 150-35 MCG/24HR transdermal patch, APPLY 1 PATCH ONCE A WEEK, Disp: 12 patch, Rfl: 2   meloxicam (MOBIC) 15 MG tablet, Take 1 tablet (15 mg total) by mouth daily., Disp: 30 tablet, Rfl: 0   predniSONE (DELTASONE) 20 MG tablet, Take 60mg  PO daily x 2 days, then40mg  PO daily x 2 days, then 20mg  PO daily x 3 days, Disp: 13 tablet, Rfl: 0   Allergies  Allergen Reactions   Bactrim [Sulfamethoxazole-Trimethoprim] Rash    Rash, dizzy, nausea     Social History   Tobacco Use   Smoking status: Never   Smokeless tobacco: Never  Vaping Use   Vaping status: Former  Substance Use Topics   Alcohol use: No   Drug use: Not Currently      Chart Review Today: I have reviewed the patient's medical history in detail and updated the computerized patient record.   Review of Systems  Constitutional: Negative.   HENT: Negative.    Eyes: Negative.   Respiratory: Negative.    Cardiovascular: Negative.   Gastrointestinal: Negative.   Endocrine: Negative.   Genitourinary: Negative.   Musculoskeletal:  Negative.   Skin: Negative.   Allergic/Immunologic: Negative.   Neurological: Negative.   Hematological: Negative.   Psychiatric/Behavioral: Negative.    All other systems reviewed and are negative.      Objective:   Vitals:   06/26/23 0940  BP: 116/78  Pulse: 96  Resp: 16  Temp: 98.1 F (36.7 C)  TempSrc: Oral  SpO2: 100%  Weight: 182 lb 1.6 oz (82.6 kg)  Height: 5\' 8"  (1.727 m)    Body mass index is 27.69 kg/m.  Physical Exam Vitals and nursing note reviewed.  Constitutional:      Appearance: She is well-developed.  HENT:     Head: Normocephalic and atraumatic.     Nose: Nose normal.  Eyes:     General:        Right eye: No discharge.        Left eye: No discharge.     Conjunctiva/sclera: Conjunctivae normal.  Neck:     Trachea: No tracheal deviation.  Cardiovascular:     Rate and Rhythm: Normal rate and regular rhythm.  Pulmonary:     Effort: Pulmonary effort is normal. No respiratory distress.     Breath sounds: No stridor.  Musculoskeletal:        General: Normal range of motion.  Skin:    General: Skin is warm and dry.     Coloration: Skin is not jaundiced  or pale.     Findings: No bruising, erythema, lesion or rash.  Neurological:     Mental Status: She is alert.     Motor: No abnormal muscle tone.     Coordination: Coordination normal.  Psychiatric:        Behavior: Behavior normal.      Results for orders placed or performed in visit on 02/20/23  Cervicovaginal ancillary only  Result Value Ref Range   Chlamydia Negative    Neisseria Gonorrhea Negative    Bacterial Vaginitis (gardnerella) Positive (A)    Candida Vaginitis Negative    Candida Glabrata Negative    Trichomonas Negative    Comment Normal Reference Ranger Chlamydia - Negative    Comment      Normal Reference Range Neisseria Gonorrhea - Negative   Comment Normal Reference Range Candida Species - Negative    Comment Normal Reference Range Candida Galbrata - Negative     Comment      Normal Reference Range Bacterial Vaginosis - Negative   Comment Normal Reference Range Trichomonas - Negative        Assessment & Plan:   1. Bruising To legs, no other brusing, nothing significant or concerning today, no petechia, no other bleeding or signs/sx Explained that coagulopathy is unlikely with her hx and w/ou other concerning signs or sx (no heavy periods, petechia etc) We can do labs to reassure pt - CBC with Differential/Platelet - Pathologist smear review - Protime-INR        Danelle Berry, PA-C 06/26/23 10:04 AM

## 2023-09-25 ENCOUNTER — Ambulatory Visit (INDEPENDENT_AMBULATORY_CARE_PROVIDER_SITE_OTHER): Payer: 59 | Admitting: Family Medicine

## 2023-09-25 ENCOUNTER — Other Ambulatory Visit (HOSPITAL_COMMUNITY)
Admission: RE | Admit: 2023-09-25 | Discharge: 2023-09-25 | Disposition: A | Discharge status: Home / Self Care | Payer: 59 | Source: Ambulatory Visit | Attending: Family Medicine | Admitting: Family Medicine

## 2023-09-25 VITALS — BP 118/70 | HR 92 | Resp 16 | Ht 68.0 in | Wt 187.0 lb

## 2023-09-25 DIAGNOSIS — N898 Other specified noninflammatory disorders of vagina: Secondary | ICD-10-CM | POA: Diagnosis not present

## 2023-09-25 NOTE — Progress Notes (Signed)
Patient ID: Casey Garza, female    DOB: Feb 10, 2001, 22 y.o.   MRN: [id number]  PCP: Danelle Berry, PA-C  Chief Complaint  Patient presents with   Vaginal Discharge    Odor. On/off x1 week    Subjective:   Casey Garza is a 22 y.o. female, presents to clinic with CC of the following:  HPI  Here for vaginal discharge with odor She treated last week with OTC boric acid tx but the sx came back about one day after she completed tx She has no vaginal irritation or itching Discharge is "white and normal looking" just with odor No burning when she pees, no abd pain, flank pain No pain with sex On Xulane patch, sexually active with one partner    Patient Active Problem List   Diagnosis Date Noted   Delivery of pregnancy by cesarean section    Severe preeclampsia, third trimester 04/22/2021   Elevated blood pressure affecting pregnancy in third trimester, antepartum 04/21/2021      Current Outpatient Medications:    norelgestromin-ethinyl estradiol (XULANE) 150-35 MCG/24HR transdermal patch, APPLY 1 PATCH ONCE A WEEK, Disp: 12 patch, Rfl: 2   Allergies  Allergen Reactions   Meloxicam Other (See Comments)    Rash to cheeks HA stomach upset - took with prednisone   Prednisone Other (See Comments)    Rash to cheeks HA stomach upset - took with mobic    Bactrim [Sulfamethoxazole-Trimethoprim] Rash    Rash, dizzy, nausea     Social History   Tobacco Use   Smoking status: Never   Smokeless tobacco: Never  Vaping Use   Vaping status: Former  Substance Use Topics   Alcohol use: No   Drug use: Not Currently      Chart Review Today: I personally reviewed active problem list, medication list, allergies, family history, social history, health maintenance, notes from last encounter, lab results, imaging with the patient/caregiver today.   Review of Systems  Constitutional: Negative.   HENT: Negative.    Eyes: Negative.   Respiratory: Negative.    Cardiovascular:  Negative.   Gastrointestinal: Negative.   Endocrine: Negative.   Genitourinary: Negative.   Musculoskeletal: Negative.   Skin: Negative.   Allergic/Immunologic: Negative.   Neurological: Negative.   Hematological: Negative.   Psychiatric/Behavioral: Negative.    All other systems reviewed and are negative.      Objective:   Vitals:   09/25/23 0926  BP: 118/70  Pulse: 92  Resp: 16  SpO2: 100%  Weight: 187 lb (84.8 kg)  Height: 5\' 8"  (1.727 m)    Body mass index is 28.43 kg/m.  Physical Exam Vitals and nursing note reviewed.  Constitutional:      Appearance: She is well-developed.  HENT:     Head: Normocephalic and atraumatic.     Nose: Nose normal.  Eyes:     General:        Right eye: No discharge.        Left eye: No discharge.     Conjunctiva/sclera: Conjunctivae normal.  Neck:     Trachea: No tracheal deviation.  Cardiovascular:     Rate and Rhythm: Normal rate and regular rhythm.  Pulmonary:     Effort: Pulmonary effort is normal. No respiratory distress.     Breath sounds: No stridor.  Musculoskeletal:        General: Normal range of motion.  Skin:    General: Skin is warm and dry.  Findings: No rash.  Neurological:     Mental Status: She is alert.     Motor: No abnormal muscle tone.     Coordination: Coordination normal.  Psychiatric:        Behavior: Behavior normal.      Results for orders placed or performed in visit on 06/26/23  CBC with Differential/Platelet  Result Value Ref Range   WBC 6.0 3.8 - 10.8 Thousand/uL   RBC 3.77 (L) 3.80 - 5.10 Million/uL   Hemoglobin 12.8 11.7 - 15.5 g/dL   HCT 16.1 09.6 - 04.5 %   MCV 98.1 80.0 - 100.0 fL   MCH 34.0 (H) 27.0 - 33.0 pg   MCHC 34.6 32.0 - 36.0 g/dL   RDW 40.9 81.1 - 91.4 %   Platelets 284 140 - 400 Thousand/uL   MPV 11.8 7.5 - 12.5 fL   Neutro Abs 3,564 1,500 - 7,800 cells/uL   Lymphs Abs 2,088 850 - 3,900 cells/uL   Absolute Monocytes 288 200 - 950 cells/uL   Eosinophils Absolute  30 15 - 500 cells/uL   Basophils Absolute 30 0 - 200 cells/uL   Neutrophils Relative % 59.4 %   Total Lymphocyte 34.8 %   Monocytes Relative 4.8 %   Eosinophils Relative 0.5 %   Basophils Relative 0.5 %  Pathologist smear review  Result Value Ref Range   Path Review    Protime-INR  Result Value Ref Range   INR 0.9    Prothrombin Time 10.3 9.0 - 11.5 sec       Assessment & Plan:     ICD-10-CM   1. Vaginal discharge  N89.8 Cervicovaginal ancillary only    CANCELED: Cervicovaginal ancillary only    Cytology ordered - pt would like to tx with oral metronidazole if + for BV Last positive earlier this year Monogamous with one female partner, on birthcontrol patch No other sx Will tx pending cytology results      Danelle Berry, PA-C 09/25/23 9:34 AM

## 2023-09-26 LAB — CERVICOVAGINAL ANCILLARY ONLY
Bacterial Vaginitis (gardnerella): POSITIVE — AB
Candida Glabrata: NEGATIVE
Candida Vaginitis: NEGATIVE
Chlamydia: NEGATIVE
Comment: NEGATIVE
Comment: NEGATIVE
Comment: NEGATIVE
Comment: NEGATIVE
Comment: NEGATIVE
Comment: NORMAL
Neisseria Gonorrhea: NEGATIVE
Trichomonas: NEGATIVE

## 2023-09-27 ENCOUNTER — Other Ambulatory Visit: Payer: Self-pay | Admitting: Family Medicine

## 2023-09-27 MED ORDER — METRONIDAZOLE 500 MG PO TABS: 500.0000 mg | ORAL_TABLET | Freq: Two times a day (BID) | ORAL | 0 refills | Status: AC

## 2023-12-27 IMAGING — US US ABDOMEN LIMITED
1 series · 14 of 25 positions shown · non-contrast
Comparison: None Available.

CLINICAL DATA: Right upper quadrant pain and fever.

EXAM:
ULTRASOUND ABDOMEN LIMITED RIGHT UPPER QUADRANT

[Series 1: us abdomen limited ruq (liver/gb) · 14 of 57 slices shown]
[im 1/57]
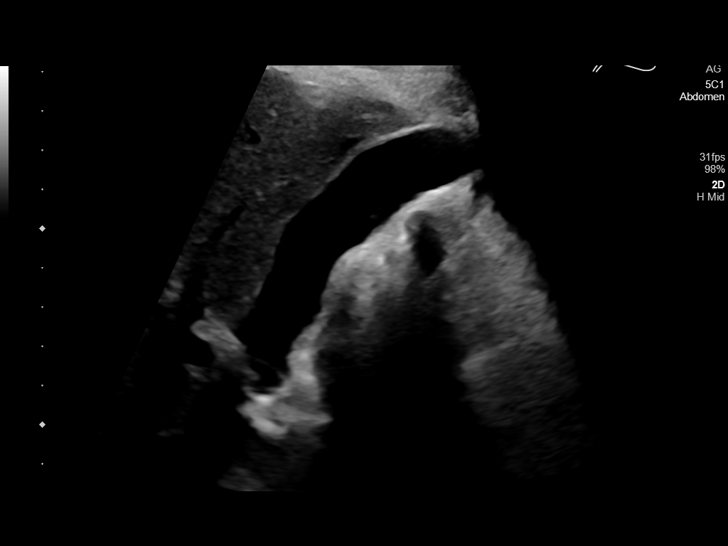
[im 5/57]
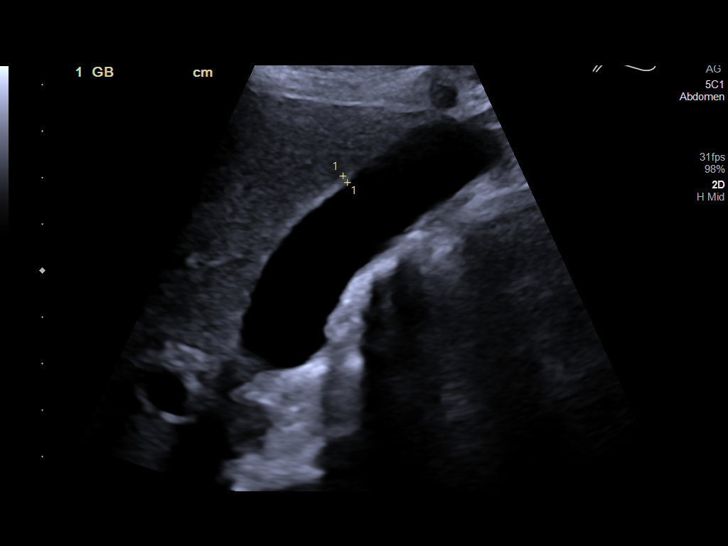
[im 10/57]
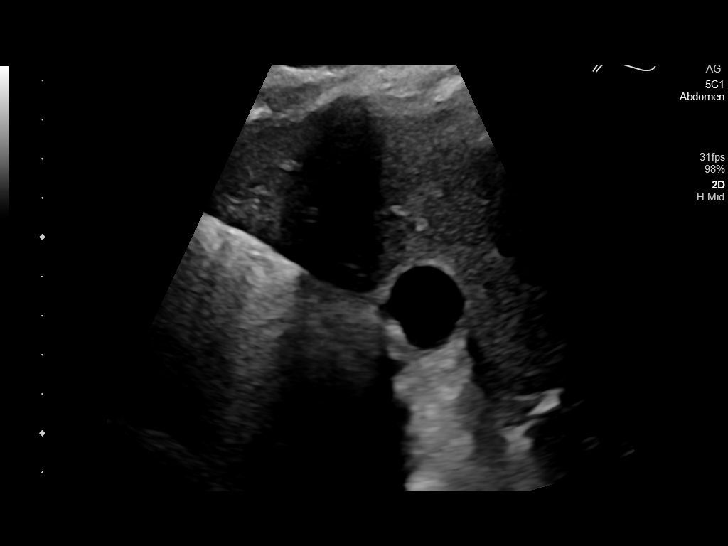
[im 15/57]
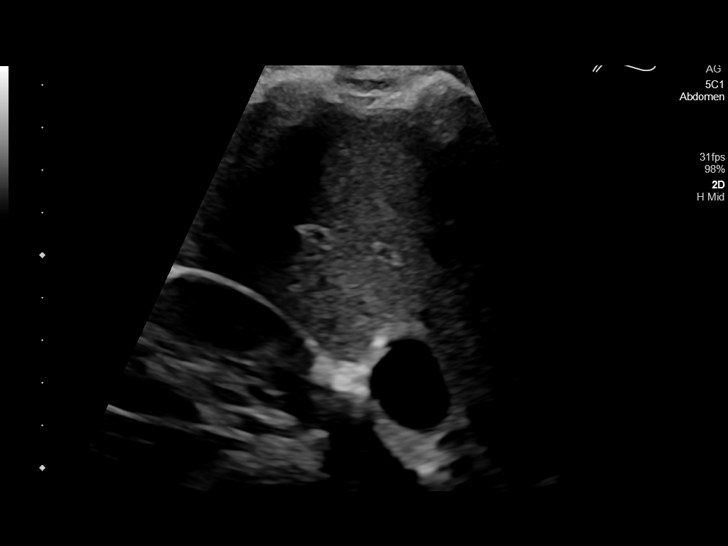
[im 19/57]
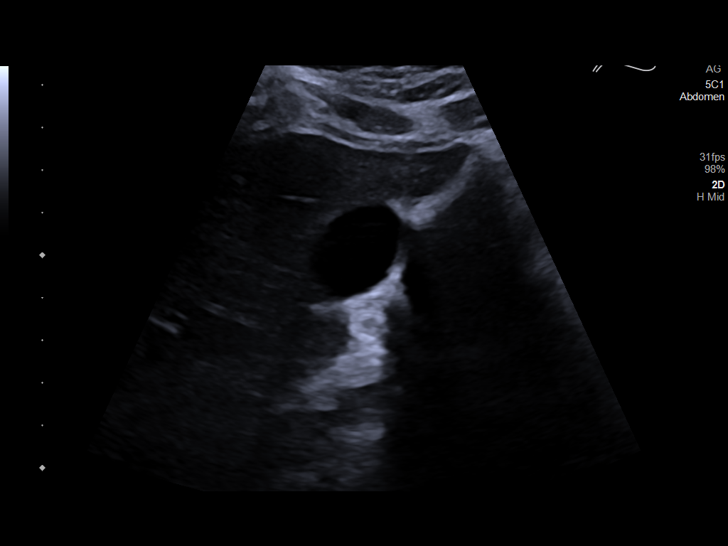
[im 22/57]
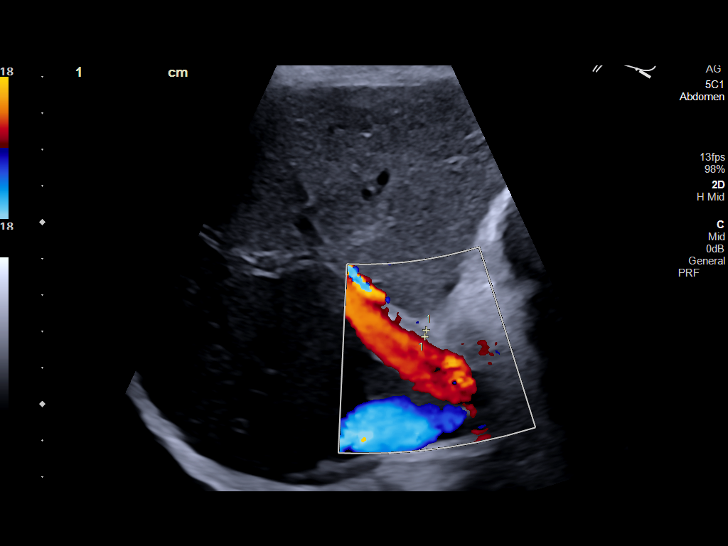
[im 26/57]
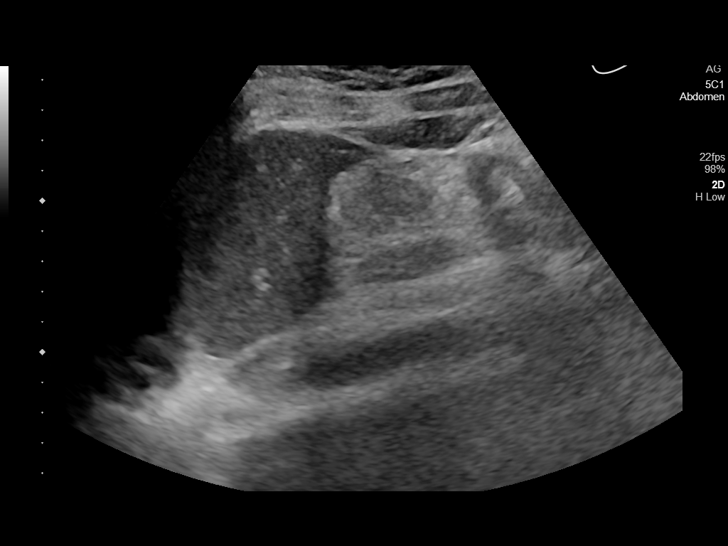
[im 31/57]
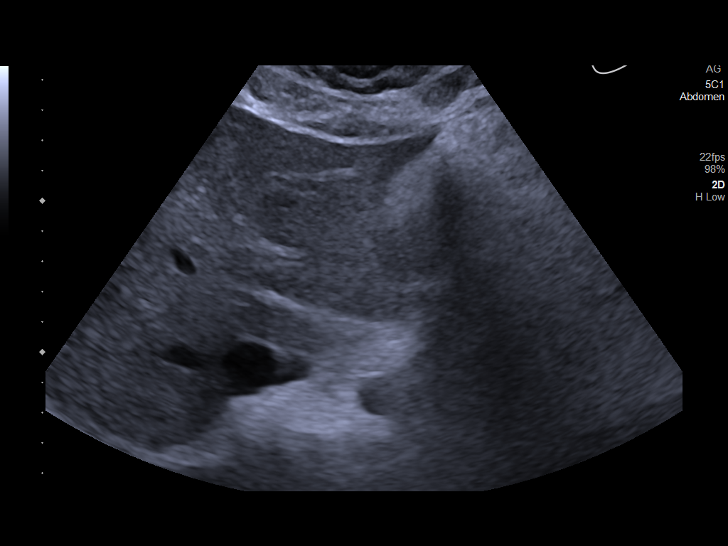
[im 36/57]
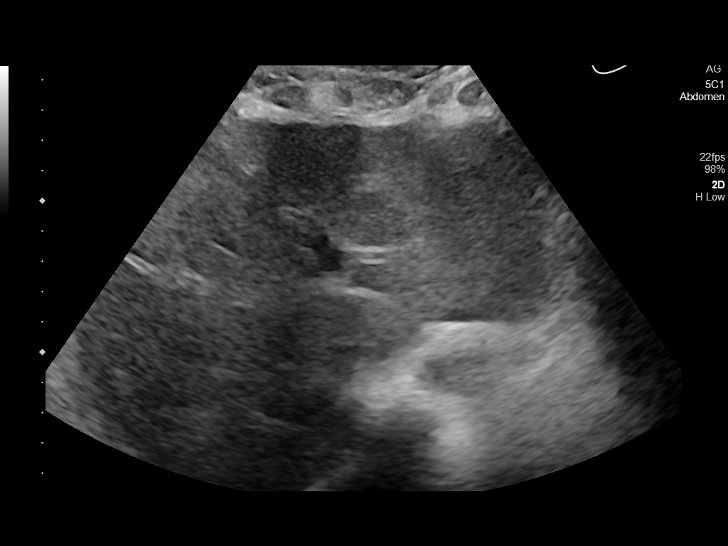
[im 38/57]
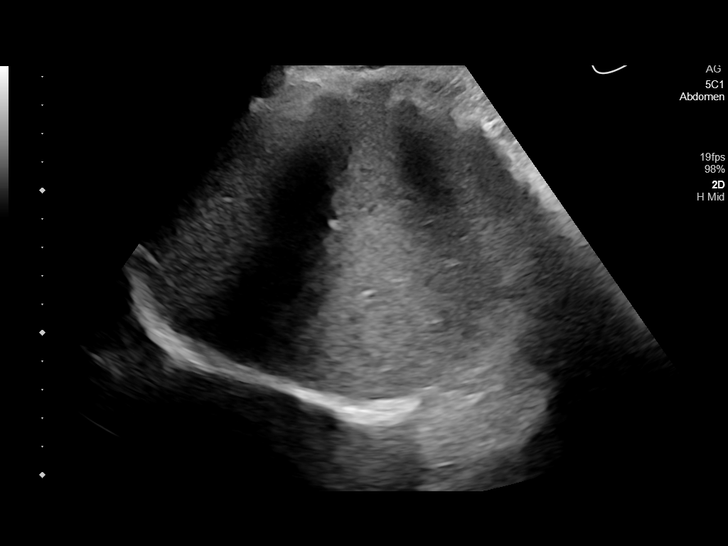
[im 43/57]
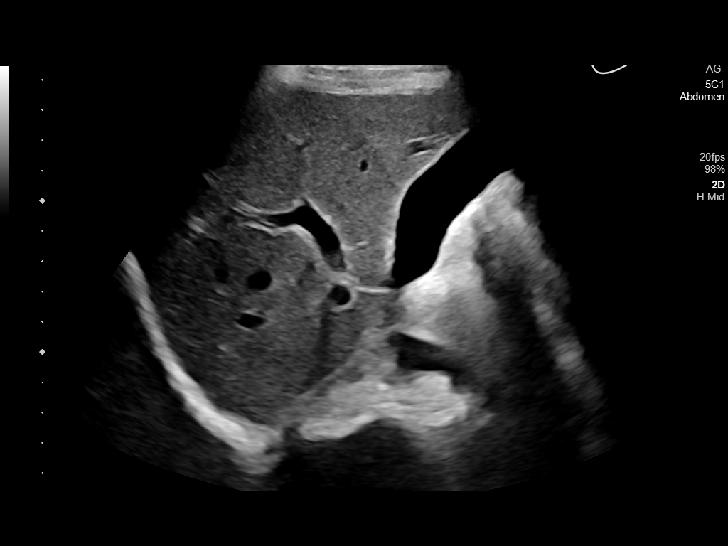
[im 47/57]
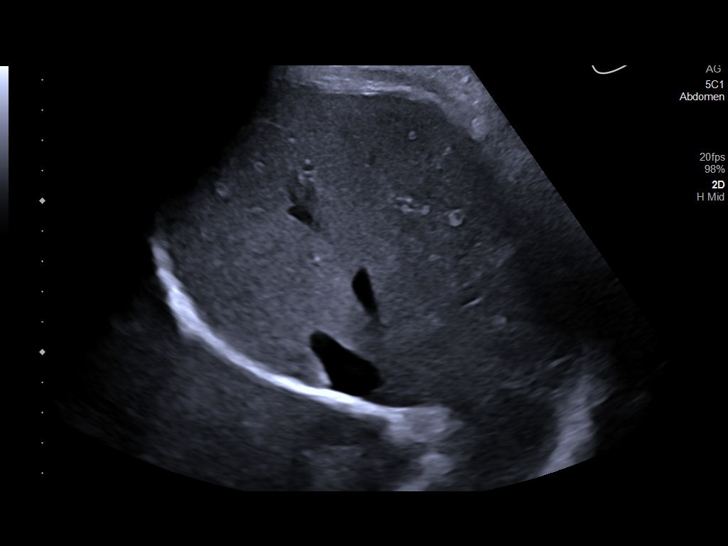
[im 52/57]
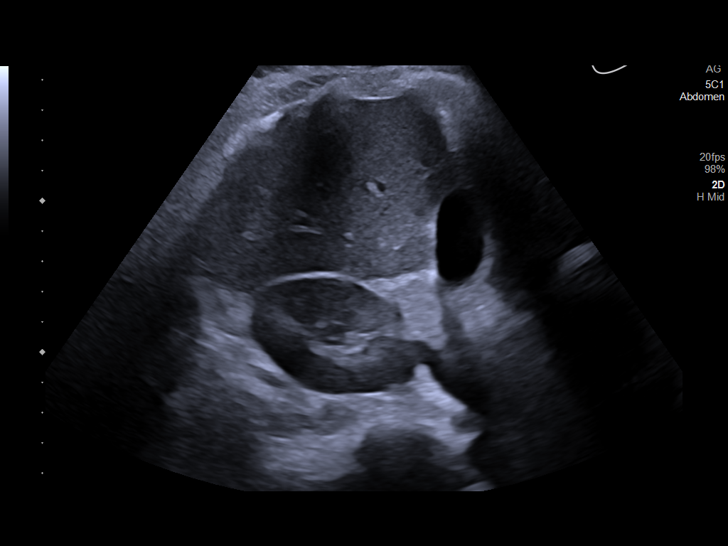
[im 57/57]
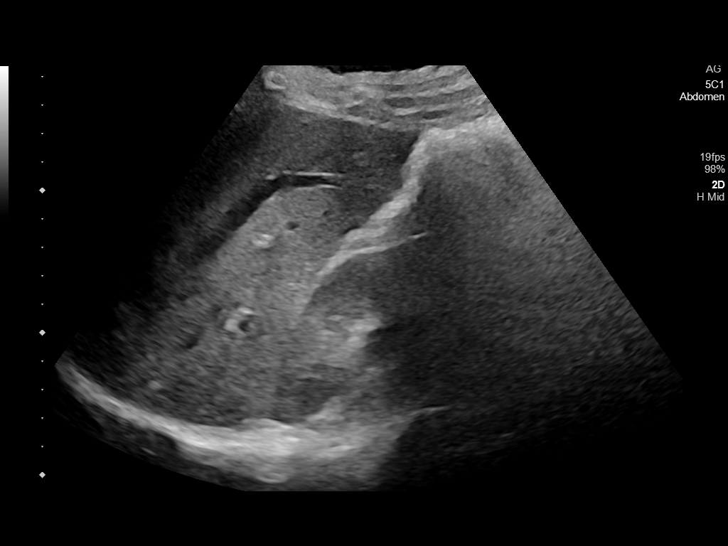

[14 of 25 positions shown; findings below may reference images not displayed]

FINDINGS: Gallbladder:

No gallstones or wall thickening visualized (1.7 mm). No sonographic
Murphy sign noted by sonographer.

Common bile duct:

Diameter: 1.8 mm

Liver:

No focal lesion identified. Within normal limits in parenchymal
echogenicity. Portal vein is patent on color Doppler imaging with
normal direction of blood flow towards the liver.

Other: None.
IMPRESSION: Normal right upper quadrant ultrasound.

## 2023-12-30 IMAGING — US US RENAL
1 series · 14 of 25 positions shown · non-contrast
Comparison: CT scan of May 03, 2022.

CLINICAL DATA: Right flank pain.

EXAM:
RENAL / URINARY TRACT ULTRASOUND COMPLETE

[Series 1: us renal · 14 of 58 slices shown]
[im 1/58]
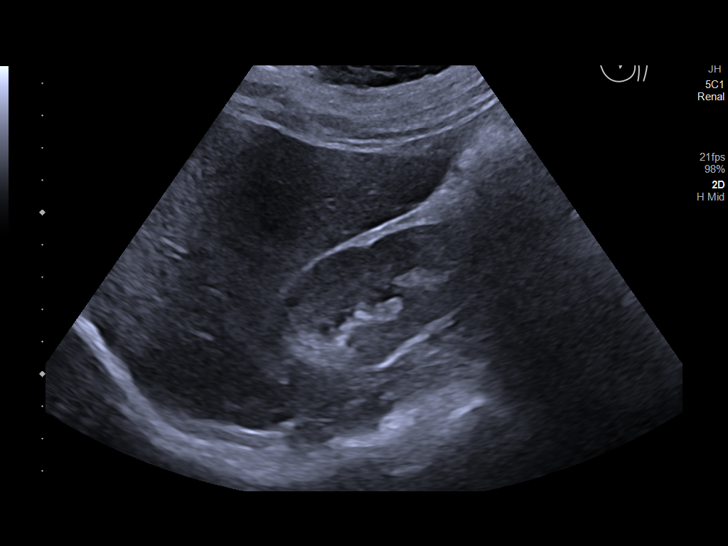
[im 5/58]
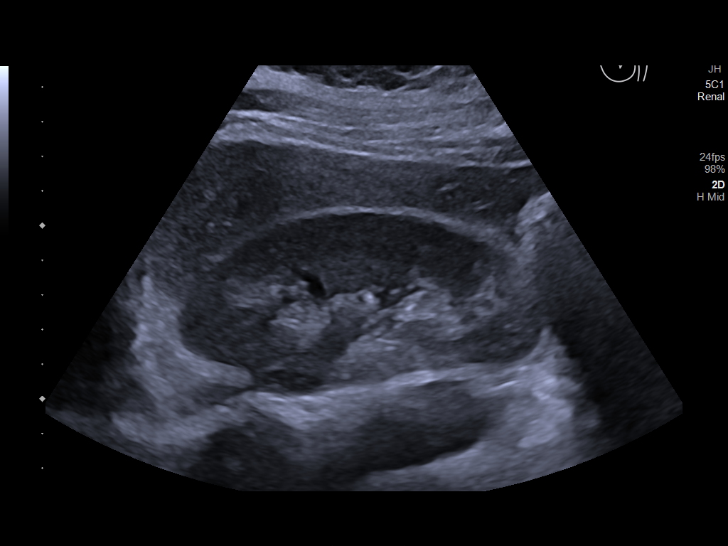
[im 10/58]
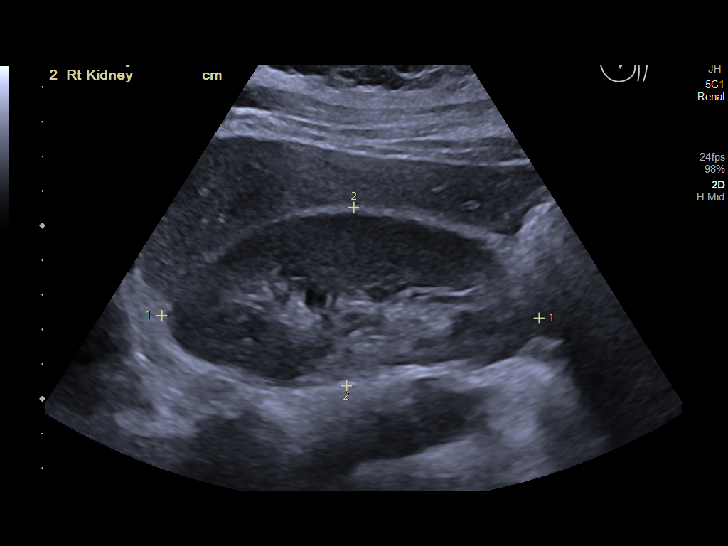
[im 15/58]
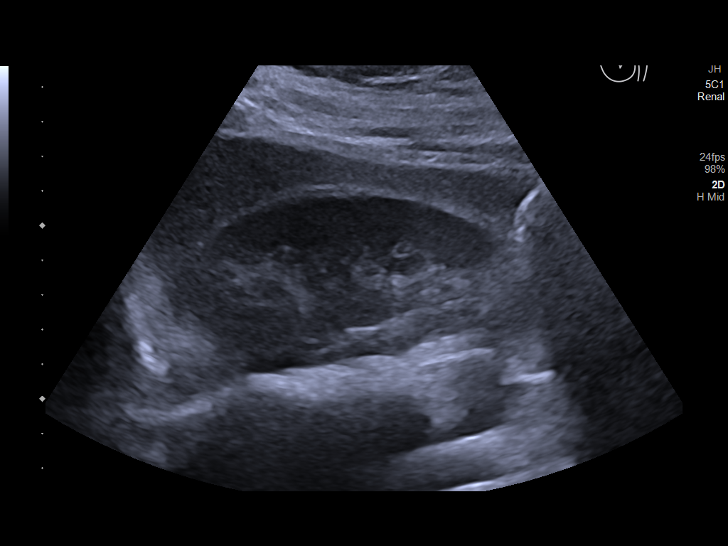
[im 20/58]
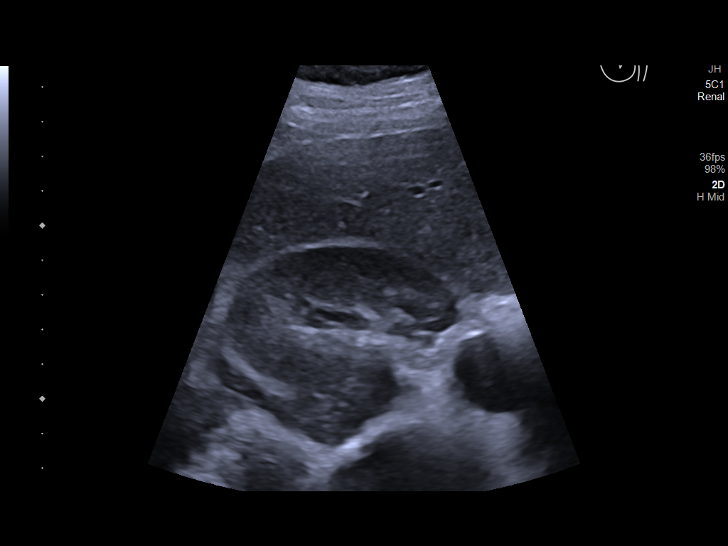
[im 22/58]
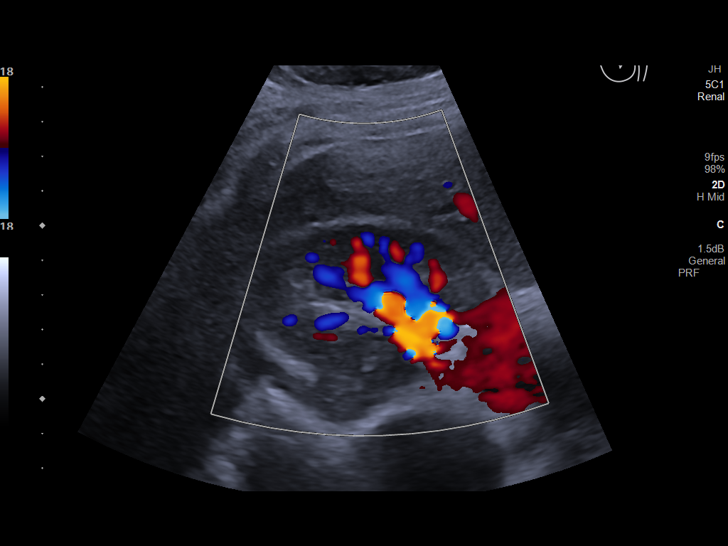
[im 27/58]
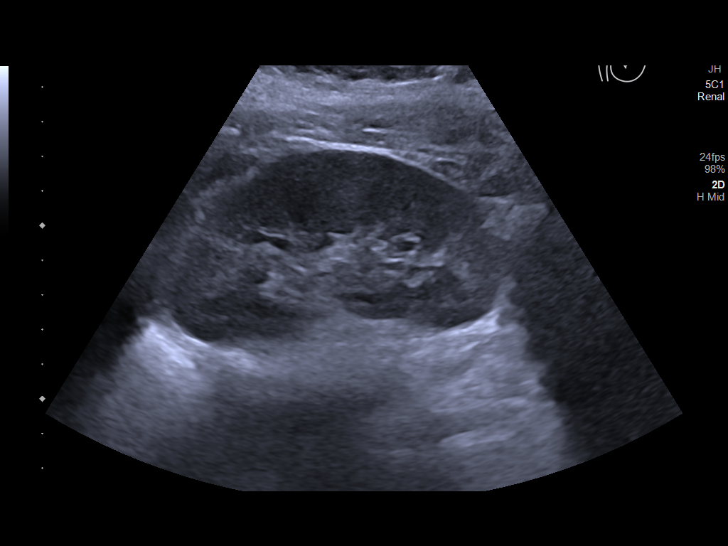
[im 31/58]
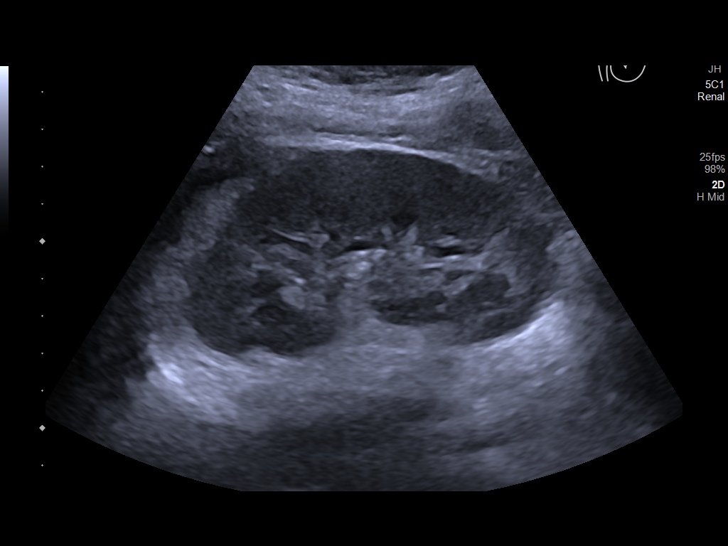
[im 36/58]
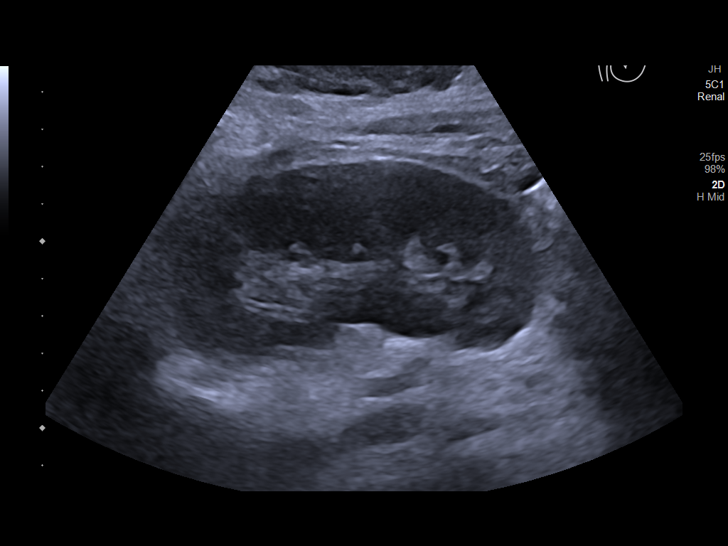
[im 39/58]
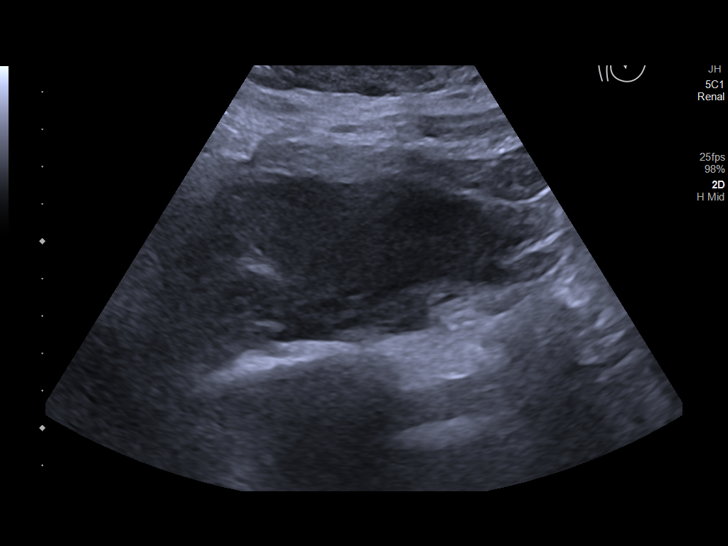
[im 43/58]
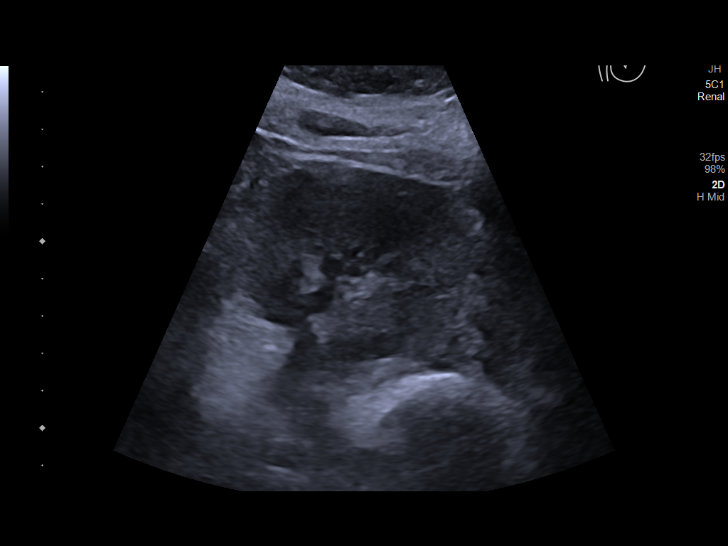
[im 48/58]
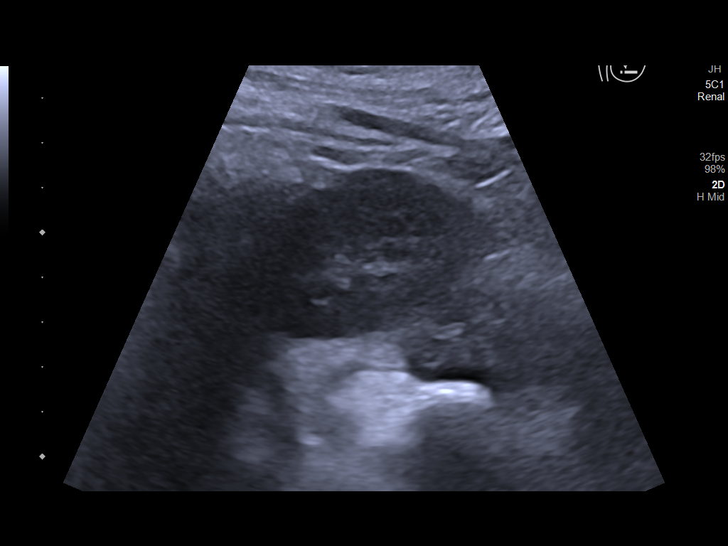
[im 53/58]
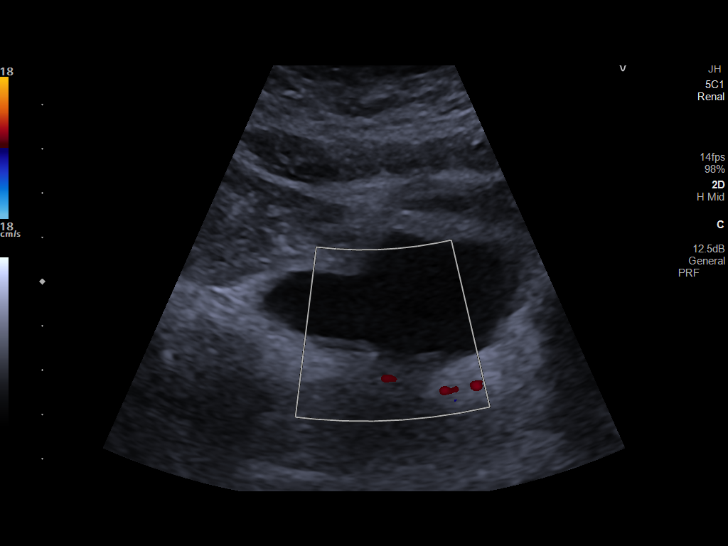
[im 58/58]
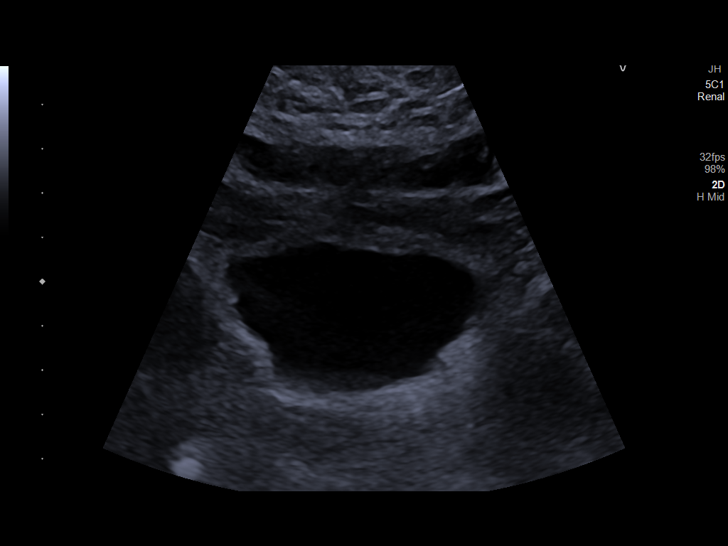

[14 of 25 positions shown; findings below may reference images not displayed]

FINDINGS: Right Kidney:

Renal measurements: 10.9 x 6.6 x 5.2 cm = volume: 193 mL.
Echogenicity within normal limits. No mass or hydronephrosis
visualized.

Left Kidney:

Renal measurements: 10.2 x 6.4 x 5.5 cm = volume: 188 mL.
Echogenicity within normal limits. No mass or hydronephrosis
visualized.

Bladder:

Appears normal for degree of bladder distention.

Other:

None.
IMPRESSION: Normal renal ultrasound.

## 2024-01-23 ENCOUNTER — Ambulatory Visit: Payer: Self-pay | Admitting: Family Medicine

## 2024-01-23 ENCOUNTER — Encounter: Payer: Self-pay | Admitting: Family Medicine

## 2024-01-23 NOTE — Telephone Encounter (Signed)
 Chief Complaint: High BP, High HR Symptoms: High BP, High HR, sweating Frequency: today Pertinent Negatives: Patient denies blurred vision, weakness, difficulty breathing Disposition: [] ED /[] Urgent Care (no appt availability in office) / [x] Appointment(In office/virtual)/ []  [place] [facility]/ [] Home Care/ [] Refused Recommended Disposition /[] [place] [facility]/ []  Follow-up with PCP Additional Notes: Patient called in stating she is currently at work and had an episode where she started feeling sweaty and faint, and went to the employer nurse, and her BP was 160/100 with a heartrate of 110. The nurse was on the line with the patient and stated she had patient take some deep breaths and BP went down to 138/88 and heartrate of 100.Patient states she is feeling better symptom wise. Patient states she did have preeclampsia with pregnancy and postpartum HTN up to 1 year. Patient states she does not believe she is currently pregnant. Patient states she has been having random episodes at home of breaking out in a sweat for no reason. Patient does not currently have bp monitor at home but advised to pick one up today after work and keep a BP log for tonight and tomorrow until appt tomorrow for evaluation.    Copied from CRM [id number]. Topic: Clinical - Red Word Triage >> Jan 23, 2024 12:09 PM Carla L wrote: Red Word that prompted transfer to Nurse Triage:  BP at 160/100, and pulse 110-120. Reason for Disposition  Systolic BP  >= 160 OR Diastolic >= 100  Answer Assessment - Initial Assessment Questions 1. BLOOD PRESSURE: "What is the blood pressure?" "Did you take at least two measurements 5 minutes apart?"     138/88 2. ONSET: "When did you take your blood pressure?"     30 min ago it was 160/100  3. HOW: "How did you take your blood pressure?" (e.g., automatic home BP monitor, visiting nurse)     Manual, sitting up  4. HISTORY: "Do you have a history of high blood pressure?"     While  pregnant and postpartum for 1 year 5. MEDICINES: "Are you taking any medicines for blood pressure?" "Have you missed any doses recently?"     Birth control  6. OTHER SYMPTOMS: "Do you have any symptoms?" (e.g., blurred vision, chest pain, difficulty breathing, headache, weakness)     Feeling sweaty, feeling faint 7. PREGNANCY: "Is there any chance you are pregnant?" "When was your last menstrual period?"     No  Protocols used: Blood Pressure - High-A-AH

## 2024-01-24 ENCOUNTER — Ambulatory Visit (INDEPENDENT_AMBULATORY_CARE_PROVIDER_SITE_OTHER): Payer: 59 | Admitting: Family Medicine

## 2024-01-24 ENCOUNTER — Ambulatory Visit: Payer: No Typology Code available for payment source | Attending: Family Medicine

## 2024-01-24 VITALS — BP 136/78 | HR 98 | Resp 16 | Ht 68.0 in | Wt 193.0 lb

## 2024-01-24 DIAGNOSIS — R030 Elevated blood-pressure reading, without diagnosis of hypertension: Secondary | ICD-10-CM

## 2024-01-24 DIAGNOSIS — R61 Generalized hyperhidrosis: Secondary | ICD-10-CM | POA: Diagnosis not present

## 2024-01-24 DIAGNOSIS — R002 Palpitations: Secondary | ICD-10-CM | POA: Diagnosis not present

## 2024-01-24 NOTE — Patient Instructions (Signed)
 No vaping, smoking, exercise or caffeine right before new follow up appointment.

## 2024-01-24 NOTE — Progress Notes (Signed)
 Patient ID: Casey Garza, female    DOB: July 17, 2001, 23 y.o.   MRN: [id number]  PCP: Danelle Berry, PA-C  Chief Complaint  Patient presents with   Hypertension    Yesterday, occurred once. Heart raced.   Excessive Sweating    On/off    Subjective:   Casey Garza is a 23 y.o. female, presents to clinic with CC of the following:  HPI  Pt sent mychart message and is here for eval today:  Msg sent yesterday: I am currently at work and I started not feeling well. My heart felt like it was pounding out my chest. I got very hot and was very light headed/ dizzy I thought I was going to pass out. I immediately came to the nurse on site. My blood pressure when she checked it was 160 over 100, pulse 110-120. Is there any time or day this week that I can be seen?   Here in office BP elevated but improved slightly with recheck, she last vaped less than 30 min ago BP Readings from Last 3 Encounters:  01/24/24 136/78  09/25/23 118/70  06/26/23 116/78   Pulse Readings from Last 3 Encounters:  01/24/24 98  09/25/23 92  06/26/23 96   Episode yesterday while working vigorously she got dizzy, lightheaded, then broke out in a sweat and heart was pounding   Wt Readings from Last 5 Encounters:  01/24/24 193 lb (87.5 kg)  09/25/23 187 lb (84.8 kg)  06/26/23 182 lb 1.6 oz (82.6 kg)  05/23/23 182 lb 14.4 oz (83 kg)  03/14/23 182 lb (82.6 kg)   BMI Readings from Last 5 Encounters:  01/24/24 29.35 kg/m  09/25/23 28.43 kg/m  06/26/23 27.69 kg/m  05/23/23 27.81 kg/m  03/14/23 28.51 kg/m   Sweats often Excessively hot Gaining weight Vaping Geek vap 30     Patient Active Problem List   Diagnosis Date Noted   Delivery of pregnancy by cesarean section    Severe preeclampsia, third trimester 04/22/2021   Elevated blood pressure affecting pregnancy in third trimester, antepartum 04/21/2021      Current Outpatient Medications:    norelgestromin-ethinyl estradiol (XULANE) 150-35  MCG/24HR transdermal patch, APPLY 1 PATCH ONCE A WEEK, Disp: 12 patch, Rfl: 2   Allergies  Allergen Reactions   Meloxicam Other (See Comments)    Rash to cheeks HA stomach upset - took with prednisone   Prednisone Other (See Comments)    Rash to cheeks HA stomach upset - took with mobic    Bactrim [Sulfamethoxazole-Trimethoprim] Rash    Rash, dizzy, nausea     Social History   Tobacco Use   Smoking status: Never   Smokeless tobacco: Never  Vaping Use   Vaping status: Former  Substance Use Topics   Alcohol use: No   Drug use: Not Currently      Chart Review Today: I personally reviewed active problem list, medication list, allergies, family history, social history, health maintenance, notes from last encounter, lab results, imaging with the patient/caregiver today.   Review of Systems  Constitutional: Negative.   HENT: Negative.    Eyes: Negative.   Respiratory: Negative.    Cardiovascular: Negative.   Gastrointestinal: Negative.   Endocrine: Negative.   Genitourinary: Negative.   Musculoskeletal: Negative.   Skin: Negative.   Allergic/Immunologic: Negative.   Neurological: Negative.   Hematological: Negative.   Psychiatric/Behavioral: Negative.    All other systems reviewed and are negative.      Objective:  Vitals:   01/24/24 1543 01/24/24 1552  BP: (!) 148/82 136/78  Pulse: (!) 105 98  Resp: 16   SpO2: 98%   Weight: 193 lb (87.5 kg)   Height: 5\' 8"  (1.727 m)     Body mass index is 29.35 kg/m.  Physical Exam Vitals and nursing note reviewed.  Constitutional:      General: She is not in acute distress.    Appearance: Normal appearance. She is well-developed. She is not ill-appearing, toxic-appearing or diaphoretic.  HENT:     Head: Normocephalic and atraumatic.     Nose: Nose normal.  Eyes:     General:        Right eye: No discharge.        Left eye: No discharge.     Conjunctiva/sclera: Conjunctivae normal.  Neck:     Trachea: No  tracheal deviation.  Cardiovascular:     Rate and Rhythm: Normal rate and regular rhythm.     Pulses: Normal pulses.     Heart sounds: Normal heart sounds. No murmur heard.    No friction rub. No gallop.  Pulmonary:     Effort: Pulmonary effort is normal. No respiratory distress.     Breath sounds: No stridor.  Musculoskeletal:        General: Normal range of motion.  Skin:    General: Skin is warm and dry.     Findings: No rash.  Neurological:     Mental Status: She is alert.     Motor: No abnormal muscle tone.     Coordination: Coordination normal.  Psychiatric:        Mood and Affect: Mood normal.        Behavior: Behavior normal.       ECG interpretation   Date: 01/24/24  Rate: 83  Rhythm: normal sinus rhythm  QRS Axis: normal  Intervals: normal  ST/T Wave abnormalities: normal  Conduction Disutrbances: none  Narrative Interpretation: NSR, normal ECG  Old EKG Reviewed: 05/03/2022 and older 05/07/2017 ECG previously sinus tachycardia and otherwise no significant changes noted       Assessment & Plan:   1. Diaphoresis (Primary) Intermittent for a "long time" with always feeling hot During exam today pt was very cold, no active sweats or diaphoresis Will check basic labs and thyroid - CBC with Differential/Platelet - COMPLETE METABOLIC PANEL WITH GFR - TSH - T4, free - EKG 12-Lead - LONG TERM MONITOR (3-14 DAYS)  2. Palpitations Episodes of forceful and fast contractions with sweats, dizziness Not actively having sx ECG NSR Possibly some sx due to vaping with high doses of nicotine - CBC with Differential/Platelet - COMPLETE METABOLIC PANEL WITH GFR - TSH - T4, free - EKG 12-Lead - LONG TERM MONITOR (3-14 DAYS)  3. Elevated blood pressure reading without diagnosis of hypertension Mildly elevated today, improved some with repeat, she did just vape before coming in clinic, this is likely keeping BP higher than her baseline Encouraged her to try to slowly  reduce use and frequency, return in 2 weeks for BP recheck and avoid stimulants and vaping for 1 hour before appt - CBC with Differential/Platelet - COMPLETE METABOLIC PANEL WITH GFR - TSH - T4, free - EKG 12-Lead - LONG TERM MONITOR (3-14 DAYS)   Return for 2 weeks BP recheck .  Will change labs to [facility] for more thorough eval and r/o pheo  - 24 hr urine fractionated catecholamines - 24 hr urine metanephrines - 24 hr urine sodium -  CBC, CMET, TSH, MG - Morning cortisol level (blood test).  Orders Placed This Encounter  Procedures   Metanephrines, Urine, 24 hour   Catecholamines, fractionated, Urine, 24 hour   Sodium, urine, 24 hour   CBC with Differential/Platelet   Comprehensive metabolic panel    Has the patient fasted?:   Yes   TSH   T4, free   Magnesium   Cortisol   LONG TERM MONITOR (3-14 DAYS)    Where should this test be performed?:   Internal    Does the patient have an implanted cardiac device?:   No    Prescribed days of wear:   14    Type of enrollment:   Home Enrollment    Vendor::   [employer]   EKG 12-Lead     Danelle Berry, PA-C 01/24/24 4:03 PM

## 2024-01-25 NOTE — Addendum Note (Signed)
 Addended by: Danelle Berry on: 01/25/2024 01:13 PM   Modules accepted: Orders

## 2024-01-29 ENCOUNTER — Encounter (INDEPENDENT_AMBULATORY_CARE_PROVIDER_SITE_OTHER): Payer: Managed Care, Other (non HMO) | Admitting: Family Medicine

## 2024-01-29 ENCOUNTER — Encounter: Payer: Self-pay | Admitting: Family Medicine

## 2024-01-29 NOTE — Addendum Note (Signed)
 Addended by: Danelle Berry on: 01/29/2024 03:35 PM   Modules accepted: Orders

## 2024-01-30 NOTE — Progress Notes (Signed)
 Pt was supposed to come for f/up after labs were complete -  No appt done today She was given the labs from last OV and f/up appt will be rescheduled for 3-4 weeks from now For when holter monitor and labs are completed  Danelle Berry, PA-C

## 2024-02-13 LAB — SODIUM, URINE, 24 HOUR
Sodium, 24H Ur: 287 mmol/(24.h) — ABNORMAL HIGH (ref 39–258)
Sodium, Ur: 287 mmol/L

## 2024-02-13 LAB — COMPREHENSIVE METABOLIC PANEL
ALT: 13 IU/L (ref 0–32)
AST: 15 IU/L (ref 0–40)
Albumin: 4.2 g/dL (ref 4.0–5.0)
Alkaline Phosphatase: 43 IU/L — ABNORMAL LOW (ref 44–121)
BUN/Creatinine Ratio: 16 (ref 9–23)
BUN: 14 mg/dL (ref 6–20)
Bilirubin Total: 0.3 mg/dL (ref 0.0–1.2)
CO2: 22 mmol/L (ref 20–29)
Calcium: 9.2 mg/dL (ref 8.7–10.2)
Chloride: 105 mmol/L (ref 96–106)
Creatinine, Ser: 0.88 mg/dL (ref 0.57–1.00)
Globulin, Total: 2.7 g/dL (ref 1.5–4.5)
Glucose: 98 mg/dL (ref 70–99)
Potassium: 4.4 mmol/L (ref 3.5–5.2)
Sodium: 139 mmol/L (ref 134–144)
Total Protein: 6.9 g/dL (ref 6.0–8.5)
eGFR: 95 mL/min/{1.73_m2} (ref >59)

## 2024-02-13 LAB — T4, FREE: Free T4: 1 ng/dL (ref 0.82–1.77)

## 2024-02-13 LAB — CBC WITH DIFFERENTIAL/PLATELET
Basophils Absolute: 0 10*3/uL (ref 0.0–0.2)
Basos: 1 %
EOS (ABSOLUTE): 0.1 10*3/uL (ref 0.0–0.4)
Eos: 1 %
Hematocrit: 38.7 % (ref 34.0–46.6)
Hemoglobin: 13.2 g/dL (ref 11.1–15.9)
Immature Grans (Abs): 0 10*3/uL (ref 0.0–0.1)
Immature Granulocytes: 0 %
Lymphocytes Absolute: 2.3 10*3/uL (ref 0.7–3.1)
Lymphs: 41 %
MCH: 34.5 pg — ABNORMAL HIGH (ref 26.6–33.0)
MCHC: 34.1 g/dL (ref 31.5–35.7)
MCV: 101 fL — ABNORMAL HIGH (ref 79–97)
Monocytes Absolute: 0.3 10*3/uL (ref 0.1–0.9)
Monocytes: 6 %
Neutrophils Absolute: 2.8 10*3/uL (ref 1.4–7.0)
Neutrophils: 51 %
Platelets: 255 10*3/uL (ref 150–450)
RBC: 3.83 x10E6/uL (ref 3.77–5.28)
RDW: 12.1 % (ref 11.7–15.4)
WBC: 5.5 10*3/uL (ref 3.4–10.8)

## 2024-02-13 LAB — TSH: TSH: 1.67 u[IU]/mL (ref 0.450–4.500)

## 2024-02-13 LAB — MAGNESIUM: Magnesium: 1.6 mg/dL (ref 1.6–2.3)

## 2024-02-13 LAB — CORTISOL: Cortisol: 25.6 ug/dL — ABNORMAL HIGH (ref 6.2–19.4)

## 2024-02-17 ENCOUNTER — Other Ambulatory Visit: Payer: Self-pay | Admitting: Family Medicine

## 2024-02-17 DIAGNOSIS — Z3045 Encounter for surveillance of transdermal patch hormonal contraceptive device: Secondary | ICD-10-CM

## 2024-02-18 LAB — CATECHOLAMINES, FRACTIONATED, URINE, 24 HOUR
Dopamine , 24H Ur: 433 ug/(24.h) (ref 0–510)
Dopamine, Rand Ur: 433 ug/L
Epinephrine, 24H Ur: 4 ug/(24.h) (ref 0–20)
Epinephrine, Rand Ur: 4 ug/L
Norepinephrine, 24H Ur: 49 ug/(24.h) (ref 0–135)
Norepinephrine, Rand Ur: 49 ug/L

## 2024-02-18 LAB — METANEPHRINES, URINE, 24 HOUR

## 2024-02-19 ENCOUNTER — Encounter: Payer: Self-pay | Admitting: Family Medicine

## 2024-02-19 ENCOUNTER — Ambulatory Visit (INDEPENDENT_AMBULATORY_CARE_PROVIDER_SITE_OTHER): Admitting: Family Medicine

## 2024-02-19 VITALS — BP 118/72 | HR 78 | Resp 16 | Ht 68.0 in | Wt 193.0 lb

## 2024-02-19 DIAGNOSIS — R002 Palpitations: Secondary | ICD-10-CM | POA: Diagnosis not present

## 2024-02-19 DIAGNOSIS — F17200 Nicotine dependence, unspecified, uncomplicated: Secondary | ICD-10-CM | POA: Diagnosis not present

## 2024-02-19 DIAGNOSIS — Z3045 Encounter for surveillance of transdermal patch hormonal contraceptive device: Secondary | ICD-10-CM

## 2024-02-19 DIAGNOSIS — R030 Elevated blood-pressure reading, without diagnosis of hypertension: Secondary | ICD-10-CM

## 2024-02-19 DIAGNOSIS — R61 Generalized hyperhidrosis: Secondary | ICD-10-CM

## 2024-02-19 MED ORDER — NORELGESTROMIN-ETH ESTRADIOL 150-35 MCG/24HR TD PTWK
MEDICATED_PATCH | TRANSDERMAL | 4 refills | Status: AC
Start: 2024-02-19 — End: ?

## 2024-02-19 NOTE — Progress Notes (Signed)
 Name: Casey Garza   MRN: [id number]    DOB: July 20, 2001   Date:02/19/2024       Progress Note  Chief Complaint  Patient presents with   Follow-up    Feeling better     Subjective:   Casey Garza is a 23 y.o. female, presents to clinic for f/up on labs, heart monitor and sweats/palpitations h igh BP Sx have stopped with less vaping/nicotine Reviewed VS BP Readings from Last 5 Encounters:  02/19/24 118/72  01/29/24 126/74  01/24/24 136/78  09/25/23 118/70  06/26/23 116/78   Pulse Readings from Last 3 Encounters:  02/19/24 78  01/29/24 72  01/24/24 98    Less palpitations, less sweats, and less dizzy episodes with cutting back on vaping  She is still wearing the heart monitor  Needs refill on birth control patch    Current Outpatient Medications:    norelgestromin-ethinyl estradiol [employer]) 150-35 MCG/24HR transdermal patch, APPLY 1 PATCH ONCE A WEEK, Disp: 12 patch, Rfl: 2  Patient Active Problem List   Diagnosis Date Noted   Delivery of pregnancy by cesarean section    Severe preeclampsia, third trimester 04/22/2021   Elevated blood pressure affecting pregnancy in third trimester, antepartum 04/21/2021    Past Surgical History:  Procedure Laterality Date   CESAREAN SECTION  04/23/2021   Procedure: CESAREAN SECTION;  Surgeon: Natale Milch, MD;  Location: [facility] ORS;  Service: Obstetrics;;   NO PAST SURGERIES      Family History  Problem Relation Age of Onset   Diabetes Maternal Grandmother    Hypertension Maternal Grandmother    Breast cancer Neg Hx    Ovarian cancer Neg Hx    Colon cancer Neg Hx     Social History   Tobacco Use   Smoking status: Never   Smokeless tobacco: Never  Vaping Use   Vaping status: Every Day   Substances: Nicotine  Substance Use Topics   Alcohol use: No   Drug use: Not Currently     Allergies  Allergen Reactions   Meloxicam Other (See Comments)    Rash to cheeks HA stomach upset - took with prednisone    Prednisone Other (See Comments)    Rash to cheeks HA stomach upset - took with mobic    Bactrim [Sulfamethoxazole-Trimethoprim] Rash    Rash, dizzy, nausea    Health Maintenance  Topic Date Due   INFLUENZA VACCINE  Never done   COVID-19 Vaccine (3 - 2024-25 season) 07/30/2023   HPV VACCINES (2 - 2-dose series) 02/20/2024 (Originally 10/04/2012)   CHLAMYDIA SCREENING  09/24/2024   Cervical Cancer Screening (Pap smear)  11/15/2024   DTaP/Tdap/Td (9 - Td or Tdap) 03/23/2031   Hepatitis C Screening  Completed   HIV Screening  Completed   Pneumococcal Vaccine 31-74 Years old  Aged Out    Chart Review Today: I personally reviewed active problem list, medication list, allergies, family history, social history, health maintenance, notes from last encounter, lab results, imaging with the patient/caregiver today.   Review of Systems  Constitutional: Negative.   HENT: Negative.    Eyes: Negative.   Respiratory: Negative.    Cardiovascular: Negative.   Gastrointestinal: Negative.   Endocrine: Negative.   Genitourinary: Negative.   Musculoskeletal: Negative.   Skin: Negative.   Allergic/Immunologic: Negative.   Neurological: Negative.   Hematological: Negative.   Psychiatric/Behavioral: Negative.    All other systems reviewed and are negative.    Objective:   Vitals:   02/19/24 1421  BP: 118/72  Pulse: 78  Resp: 16  SpO2: 99%  Weight: 193 lb (87.5 kg)  Height: 5\' 8"  (1.727 m)    Body mass index is 29.35 kg/m.  Physical Exam Vitals and nursing note reviewed.  Constitutional:      Appearance: She is well-developed.  HENT:     Head: Normocephalic and atraumatic.     Nose: Nose normal.  Eyes:     General:        Right eye: No discharge.        Left eye: No discharge.     Conjunctiva/sclera: Conjunctivae normal.  Neck:     Trachea: No tracheal deviation.  Cardiovascular:     Rate and Rhythm: Normal rate and regular rhythm.     Pulses: Normal pulses.     Heart  sounds: Normal heart sounds. No murmur heard.    No friction rub. No gallop.  Pulmonary:     Effort: Pulmonary effort is normal. No respiratory distress.     Breath sounds: No stridor.  Musculoskeletal:        General: Normal range of motion.  Skin:    General: Skin is warm and dry.     Capillary Refill: Capillary refill takes less than 2 seconds.     Findings: No rash.  Neurological:     Mental Status: She is alert.     Motor: No abnormal muscle tone.     Coordination: Coordination normal.  Psychiatric:        Behavior: Behavior normal.      Functional Status Survey:   Results for orders placed or performed in visit on 01/24/24  Metanephrines, Urine, 24 hour   Collection Time: 02/12/24  8:30 AM  Result Value Ref Range   Normetanephrine, Ur CANCELED ug/L   Normetanephrine, 24H Ur CANCELED ug/24 hr   Metaneph Total, Ur CANCELED ug/L   Metanephrines, 24H Ur CANCELED ug/24 hr  Catecholamines, fractionated, Urine, 24 hour   Collection Time: 02/12/24  8:30 AM  Result Value Ref Range   Epinephrine, Rand Ur 4 Undefined ug/L   Epinephrine, 24H Ur 4 0 - 20 ug/24 hr   Norepinephrine, Rand Ur 49 Undefined ug/L   Norepinephrine, 24H Ur 49 0 - 135 ug/24 hr   Dopamine, Rand Ur 433 Undefined ug/L   Dopamine , 24H Ur 433 0 - 510 ug/24 hr  Sodium, urine, 24 hour   Collection Time: 02/12/24  8:38 AM  Result Value Ref Range   Sodium, Ur 287 Not Estab. mmol/L   Sodium, 24H Ur 287 (H) 39 - 258 mmol/24 hr  CBC with Differential/Platelet   Collection Time: 02/12/24  8:38 AM  Result Value Ref Range   WBC 5.5 3.4 - 10.8 x10E3/uL   RBC 3.83 3.77 - 5.28 x10E6/uL   Hemoglobin 13.2 11.1 - 15.9 g/dL   Hematocrit 19.1 47.8 - 46.6 %   MCV 101 (H) 79 - 97 fL   MCH 34.5 (H) 26.6 - 33.0 pg   MCHC 34.1 31.5 - 35.7 g/dL   RDW 29.5 62.1 - 30.8 %   Platelets 255 150 - 450 x10E3/uL   Neutrophils 51 Not Estab. %   Lymphs 41 Not Estab. %   Monocytes 6 Not Estab. %   Eos 1 Not Estab. %   Basos 1 Not  Estab. %   Neutrophils Absolute 2.8 1.4 - 7.0 x10E3/uL   Lymphocytes Absolute 2.3 0.7 - 3.1 x10E3/uL   Monocytes Absolute 0.3 0.1 - 0.9 x10E3/uL  EOS (ABSOLUTE) 0.1 0.0 - 0.4 x10E3/uL   Basophils Absolute 0.0 0.0 - 0.2 x10E3/uL   Immature Granulocytes 0 Not Estab. %   Immature Grans (Abs) 0.0 0.0 - 0.1 x10E3/uL  Comprehensive metabolic panel   Collection Time: 02/12/24  8:38 AM  Result Value Ref Range   Glucose 98 70 - 99 mg/dL   BUN 14 6 - 20 mg/dL   Creatinine, Ser 1.61 0.57 - 1.00 mg/dL   eGFR 95 >09 UE/AVW/0.98   BUN/Creatinine Ratio 16 9 - 23   Sodium 139 134 - 144 mmol/L   Potassium 4.4 3.5 - 5.2 mmol/L   Chloride 105 96 - 106 mmol/L   CO2 22 20 - 29 mmol/L   Calcium 9.2 8.7 - 10.2 mg/dL   Total Protein 6.9 6.0 - 8.5 g/dL   Albumin 4.2 4.0 - 5.0 g/dL   Globulin, Total 2.7 1.5 - 4.5 g/dL   Bilirubin Total 0.3 0.0 - 1.2 mg/dL   Alkaline Phosphatase 43 (L) 44 - 121 IU/L   AST 15 0 - 40 IU/L   ALT 13 0 - 32 IU/L  TSH   Collection Time: 02/12/24  8:38 AM  Result Value Ref Range   TSH 1.670 0.450 - 4.500 uIU/mL  T4, free   Collection Time: 02/12/24  8:38 AM  Result Value Ref Range   Free T4 1.00 0.82 - 1.77 ng/dL  Magnesium   Collection Time: 02/12/24  8:38 AM  Result Value Ref Range   Magnesium 1.6 1.6 - 2.3 mg/dL  Cortisol   Collection Time: 02/12/24  8:38 AM  Result Value Ref Range   Cortisol 25.6 (H) 6.2 - 19.4 ug/dL      Assessment & Plan:   1. Diaphoresis (Primary) Improved with cutting back on vape/nicotine  2. Palpitations Less episodes - still wearing heart monitor On exam today RRR Labs reviewed, no anemia, normal thyroid labs, no electrolyte abnormality, neg mmetanephrine testing  3. Elevated blood pressure reading without diagnosis of hypertension Improved with less nicotine/vaping BP Readings from Last 3 Encounters:  02/19/24 118/72  01/29/24 126/74  01/24/24 136/78    4. Nicotine use disorder Encouraged continued limited use and  reducing use with goal of total cessation of vape/nicotine use  5. Encounter for surveillance of transdermal patch hormonal contraceptive device BP improved No new sexual partners, no missed doses Refills ordered - norelgestromin-ethinyl estradiol (XULANE) 150-35 MCG/24HR transdermal patch; APPLY 1 PATCH ONCE A WEEK  Dispense: 9 patch; Refill: 4    Return in about 6 months (around 08/21/2024) for Annual Physical sooner if needed for return of symptoms.   Danelle Berry, PA-C 02/19/24 2:31 PM

## 2024-02-27 ENCOUNTER — Ambulatory Visit (INDEPENDENT_AMBULATORY_CARE_PROVIDER_SITE_OTHER): Admitting: Dermatology

## 2024-02-27 ENCOUNTER — Encounter: Payer: Self-pay | Admitting: Dermatology

## 2024-02-27 DIAGNOSIS — Z79899 Other long term (current) drug therapy: Secondary | ICD-10-CM

## 2024-02-27 DIAGNOSIS — R239 Unspecified skin changes: Secondary | ICD-10-CM

## 2024-02-27 DIAGNOSIS — Z7189 Other specified counseling: Secondary | ICD-10-CM

## 2024-02-27 DIAGNOSIS — L910 Hypertrophic scar: Secondary | ICD-10-CM | POA: Diagnosis not present

## 2024-02-27 MED ORDER — TRIAMCINOLONE ACETONIDE 10 MG/ML IJ SUSP
10.0000 mg | Freq: Once | INTRAMUSCULAR | Status: AC
  Administered 2024-02-27: 10 mg via INTRADERMAL

## 2024-02-27 NOTE — Patient Instructions (Signed)

## 2024-02-27 NOTE — Progress Notes (Signed)
   Follow-Up Visit   Subjective  Casey Garza is a 23 y.o. female who presents for the following: Thickened scar at caesarian section site since 2022. She used Mederma gel but she does not feel like it helped.  The following portions of the chart were reviewed this encounter and updated as appropriate: medications, allergies, medical history  Review of Systems:  No other skin or systemic complaints except as noted in HPI or Assessment and Plan.  Objective  Well appearing patient in no apparent distress; mood and affect are within normal limits.  A focused examination was performed of the following areas: Abdomen  Relevant exam findings are noted in the Assessment and Plan.  Suprapubic area Firm pink/brown dermal plaque at site of caesarian section.    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 Advised may take multiple treatments for best results. Even if it helps, she can have recurrence of symptoms and thickness of the scar. He understands and wants to pursue treatment today.  KELOID Suprapubic area Intralesional injection - Suprapubic area Location: suprapubic area  Informed Consent: Discussed risks (infection, pain, bleeding, bruising, thinning of the skin, loss of skin pigment, lack of resolution, and recurrence of lesion) and benefits of the procedure, as well as the alternatives. Informed consent was obtained. Preparation: The area was prepared a standard fashion.  Anesthesia: none  Procedure Details: An intralesional injection was performed with Kenalog 10 mg/cc. 0.2 cc in total were injected.  Total number of injections: 8 (to left half of scar)  Plan: The patient was instructed on post-op care. Recommend OTC analgesia as needed for pain.  Related Medications triamcinolone acetonide (KENALOG) 10 MG/ML injection 10 mg   Return in about 3 months  (around 05/28/2024) for Keloid.  I, Joanie Coddington, CMA, am acting as scribe for Armida Sans, MD .   Documentation: I have reviewed the above documentation for accuracy and completeness, and I agree with the above.  Armida Sans, MD

## 2024-02-29 ENCOUNTER — Ambulatory Visit: Admitting: Dermatology

## 2024-03-07 ENCOUNTER — Ambulatory Visit: Payer: 59 | Admitting: Dermatology

## 2024-03-09 DIAGNOSIS — R030 Elevated blood-pressure reading, without diagnosis of hypertension: Secondary | ICD-10-CM

## 2024-03-09 DIAGNOSIS — R61 Generalized hyperhidrosis: Secondary | ICD-10-CM | POA: Diagnosis not present

## 2024-03-09 DIAGNOSIS — R002 Palpitations: Secondary | ICD-10-CM

## 2024-03-12 ENCOUNTER — Encounter: Payer: Self-pay | Admitting: Family Medicine

## 2024-06-03 ENCOUNTER — Ambulatory Visit (INDEPENDENT_AMBULATORY_CARE_PROVIDER_SITE_OTHER): Admitting: Dermatology

## 2024-06-03 ENCOUNTER — Encounter: Payer: Self-pay | Admitting: Dermatology

## 2024-06-03 DIAGNOSIS — Z79899 Other long term (current) drug therapy: Secondary | ICD-10-CM

## 2024-06-03 DIAGNOSIS — Z7189 Other specified counseling: Secondary | ICD-10-CM

## 2024-06-03 DIAGNOSIS — L910 Hypertrophic scar: Secondary | ICD-10-CM

## 2024-06-03 NOTE — Patient Instructions (Signed)

## 2024-06-03 NOTE — Progress Notes (Signed)
   Follow-Up Visit   Subjective  Casey Garza is a 23 y.o. female who presents for the following: 3 months f/u on keloid scar on her abdomen, treated with ILK injections 3 months ago with a good response, patient report keloid is softer.  The following portions of the chart were reviewed this encounter and updated as appropriate: medications, allergies, medical history  Review of Systems:  No other skin or systemic complaints except as noted in HPI or Assessment and Plan.  Objective  Well appearing patient in no apparent distress; mood and affect are within normal limits.  A focused examination was performed of the following areas: Abdomen   Relevant exam findings are noted in the Assessment and Plan.  Suprapubic area Firm pink/brown dermal plaque  Assessment & Plan   KELOID Suprapubic area Improved  Intralesional injection - Suprapubic area Location: Suprapubic area  Informed Consent: Discussed risks (infection, pain, bleeding, bruising, thinning of the skin, loss of skin pigment, lack of resolution, and recurrence of lesion) and benefits of the procedure, as well as the alternatives. Informed consent was obtained. Preparation: The area was prepared a standard fashion.  Procedure Details: An intralesional injection was performed with Kenalog  10 mg/cc. 0.2 cc in total were injected. NDC 9996-9505-79 Lot [id number] Exp 04/28/2026 Total number of injections: 4  Plan: The patient was instructed on post-op care. Recommend OTC analgesia as needed for pain.    Return in about 6 months (around 12/04/2024) for Keloid .  IFay Kirks, CMA, am acting as scribe for Alm Rhyme, MD .   Documentation: I have reviewed the above documentation for accuracy and completeness, and I agree with the above.  Alm Rhyme, MD

## 2024-12-04 ENCOUNTER — Encounter: Payer: Self-pay | Admitting: Emergency Medicine

## 2024-12-04 ENCOUNTER — Encounter: Payer: Self-pay | Admitting: Family Medicine

## 2024-12-04 ENCOUNTER — Other Ambulatory Visit: Payer: Self-pay

## 2024-12-04 ENCOUNTER — Ambulatory Visit (INDEPENDENT_AMBULATORY_CARE_PROVIDER_SITE_OTHER): Admitting: Family Medicine

## 2024-12-04 ENCOUNTER — Emergency Department
Admission: EM | Admit: 2024-12-04 | Discharge: 2024-12-04 | Disposition: A | Discharge status: Home / Self Care | Attending: Emergency Medicine | Admitting: Emergency Medicine

## 2024-12-04 ENCOUNTER — Emergency Department

## 2024-12-04 VITALS — BP 152/90 | HR 105 | Resp 16 | Ht 68.0 in | Wt 200.0 lb

## 2024-12-04 DIAGNOSIS — R030 Elevated blood-pressure reading, without diagnosis of hypertension: Secondary | ICD-10-CM

## 2024-12-04 DIAGNOSIS — R42 Dizziness and giddiness: Secondary | ICD-10-CM | POA: Insufficient documentation

## 2024-12-04 DIAGNOSIS — I10 Essential (primary) hypertension: Secondary | ICD-10-CM | POA: Diagnosis not present

## 2024-12-04 DIAGNOSIS — R0602 Shortness of breath: Secondary | ICD-10-CM

## 2024-12-04 DIAGNOSIS — G4489 Other headache syndrome: Secondary | ICD-10-CM | POA: Insufficient documentation

## 2024-12-04 DIAGNOSIS — R519 Headache, unspecified: Secondary | ICD-10-CM | POA: Diagnosis present

## 2024-12-04 LAB — URINALYSIS, ROUTINE W REFLEX MICROSCOPIC
Bacteria, UA: NONE SEEN
Bilirubin Urine: NEGATIVE
Glucose, UA: NEGATIVE mg/dL
Hgb urine dipstick: NEGATIVE
Ketones, ur: NEGATIVE mg/dL
Leukocytes,Ua: NEGATIVE
Nitrite: NEGATIVE
Protein, ur: NEGATIVE mg/dL
RBC / HPF: 0 RBC/hpf (ref 0–5)
Specific Gravity, Urine: 1.008 (ref 1.005–1.030)
pH: 7 (ref 5.0–8.0)

## 2024-12-04 LAB — CBC
HCT: 43 % (ref 36.0–46.0)
Hemoglobin: 14.2 g/dL (ref 12.0–15.0)
MCH: 33.2 pg (ref 26.0–34.0)
MCHC: 33 g/dL (ref 30.0–36.0)
MCV: 100.5 fL — ABNORMAL HIGH (ref 80.0–100.0)
Platelets: 280 K/uL (ref 150–400)
RBC: 4.28 MIL/uL (ref 3.87–5.11)
RDW: 12.8 % (ref 11.5–15.5)
WBC: 7.2 K/uL (ref 4.0–10.5)
nRBC: 0 % (ref 0.0–0.2)

## 2024-12-04 LAB — COMPREHENSIVE METABOLIC PANEL WITH GFR
ALT: 16 U/L (ref 0–44)
AST: 23 U/L (ref 15–41)
Albumin: 4.7 g/dL (ref 3.5–5.0)
Alkaline Phosphatase: 46 U/L (ref 38–126)
Anion gap: 15 (ref 5–15)
BUN: 8 mg/dL (ref 6–20)
CO2: 22 mmol/L (ref 22–32)
Calcium: 9.8 mg/dL (ref 8.9–10.3)
Chloride: 104 mmol/L (ref 98–111)
Creatinine, Ser: 0.86 mg/dL (ref 0.44–1.00)
GFR, Estimated: >60 mL/min
Glucose, Bld: 78 mg/dL (ref 70–99)
Potassium: 3.9 mmol/L (ref 3.5–5.1)
Sodium: 141 mmol/L (ref 135–145)
Total Bilirubin: 0.6 mg/dL (ref 0.0–1.2)
Total Protein: 8.2 g/dL — ABNORMAL HIGH (ref 6.5–8.1)

## 2024-12-04 LAB — RESP PANEL BY RT-PCR (RSV, FLU A&B, COVID)  RVPGX2
Influenza A by PCR: NEGATIVE
Influenza B by PCR: NEGATIVE
Resp Syncytial Virus by PCR: NEGATIVE
SARS Coronavirus 2 by RT PCR: NEGATIVE

## 2024-12-04 MED ORDER — MORPHINE SULFATE (PF) 2 MG/ML IV SOLN
2.0000 mg | Freq: Once | INTRAVENOUS | Status: AC
  Administered 2024-12-04: 2 mg via INTRAVENOUS
  Filled 2024-12-04: qty 1

## 2024-12-04 MED ORDER — LORAZEPAM 2 MG/ML IJ SOLN
2.0000 mg | Freq: Once | INTRAMUSCULAR | Status: AC
  Administered 2024-12-04: 2 mg via INTRAVENOUS
  Filled 2024-12-04: qty 1

## 2024-12-04 MED ORDER — SODIUM CHLORIDE 0.9 % IV SOLN
25.0000 mg | Freq: Once | INTRAVENOUS | Status: AC
  Administered 2024-12-04: 25 mg via INTRAVENOUS
  Filled 2024-12-04: qty 1

## 2024-12-04 MED ORDER — MECLIZINE HCL 25 MG PO TABS: 25.0000 mg | ORAL_TABLET | Freq: Three times a day (TID) | ORAL | 0 refills | Status: AC | PRN

## 2024-12-04 MED ORDER — ONDANSETRON HCL 4 MG/2ML IJ SOLN
4.0000 mg | Freq: Once | INTRAMUSCULAR | Status: AC
  Administered 2024-12-04: 4 mg via INTRAVENOUS
  Filled 2024-12-04: qty 2

## 2024-12-04 MED ORDER — SODIUM CHLORIDE 0.9 % IV BOLUS
1000.0000 mL | Freq: Once | INTRAVENOUS | Status: AC
  Administered 2024-12-04: 1000 mL via INTRAVENOUS

## 2024-12-04 MED ORDER — ACETAMINOPHEN 500 MG PO TABS
1000.0000 mg | ORAL_TABLET | Freq: Once | ORAL | Status: AC
  Administered 2024-12-04: 1000 mg via ORAL
  Filled 2024-12-04: qty 2

## 2024-12-04 MED ORDER — DIPHENHYDRAMINE HCL 50 MG/ML IJ SOLN
50.0000 mg | Freq: Once | INTRAMUSCULAR | Status: AC
  Administered 2024-12-04: 50 mg via INTRAVENOUS
  Filled 2024-12-04: qty 1

## 2024-12-04 NOTE — ED Provider Notes (Signed)
 "  [facility] Provider Note    Event Date/Time   First MD Initiated Contact with Patient 12/04/24 1703     (approximate)   History   Headache   HPI  Casey Garza is a 24 y.o. female  with a past medical history of vitamin D  deficiency, acne, elevated blood pressure in prior pregnancy presents to the emergency department with persistent headache, nausea and dizziness that started yesterday. The dizziness is only when moving from a sit to stand position. She also reports 1 episode of blurry vision described as seeing stars yesterday at work with the same symptoms.  She reports she has had some intermittent high blood pressure readings at work yesterday as well but does not recall the specific values. Reports she has a Hx of HTN in pregnancy in 2022 and was on antihypertensives for one year then discontinued.  She describes the headache as all over her head and it radiates down to her neck and reports some tenderness in her maxillary sinus region. Denies any current vision changes, photophobia, sore throat, eye pain, fever, chills, myalgias, chest pain, SOB, vomiting, weakness. Denies fall or injury. She has not been drinking many fluids at home today. Has allergies to Meloxicam  and Prednisone . Reports NSAIDs cause upset stomach. No hx of migraines.  Physical Exam   Triage Vital Signs: ED Triage Vitals [12/04/24 1600]  Encounter Vitals Group     BP (!) 148/97     Girls Systolic BP Percentile      Girls Diastolic BP Percentile      Boys Systolic BP Percentile      Boys Diastolic BP Percentile      Pulse Rate (!) 104     Resp 18     Temp 98.6 F (37 C)     Temp Source Oral     SpO2 100 %     Weight      Height      Head Circumference      Peak Flow      Pain Score 10     Pain Loc      Pain Education      Exclude from Growth Chart     Most recent vital signs: Vitals:   12/04/24 1600 12/04/24 2004  BP: (!) 148/97 118/63  Pulse: (!) 104 92  Resp: 18 18   Temp: 98.6 F (37 C)   SpO2: 100% 100%    General: Awake, in no acute distress. Appears stated age. Head: Normocephalic, atraumatic. Eyes: PERRLA. No scleral icterus or conjunctival injection. Ears/Nose/Throat: TMs intact b/l. Nares patent, no nasal discharge. Oropharynx moist, no erythema or exudate. Dentition intact. Neck: Supple, no lymphadenopathy, no nuchal rigidity. Able to fully range neck with no pain, good strength. CV: Good peripheral perfusion. Tachy at 104 bpm on arrival.  Respiratory:Normal respiratory effort.  No respiratory distress. CTAB. GI: Soft, non-distended, non-tender.  MSK: Normal ROM and  5/5 strength in b/l upper and lower extremities.  Skin:Warm, dry, intact. No rashes, lesions, or ecchymosis.  Neurological: A&Ox4 to person, place, time, and situation. Cranial nerves II-XII intact. Sensation intact and equal to b/l upper and lower extremities. Strength symmetric. No focal deficits.  Psychiatric: Mood and affect appropriate. Thought processes coherent.  TTP along b/l maxillary sinus regions. Mild tenderness to midline cervical region.  ED Results / Procedures / Treatments   Labs (all labs ordered are listed, but only abnormal results are displayed) Labs Reviewed  URINALYSIS, ROUTINE W REFLEX MICROSCOPIC -  Abnormal; Notable for the following components:      Result Value   Color, Urine STRAW (*)    APPearance CLEAR (*)    All other components within normal limits  CBC - Abnormal; Notable for the following components:   MCV 100.5 (*)    All other components within normal limits  COMPREHENSIVE METABOLIC PANEL WITH GFR - Abnormal; Notable for the following components:   Total Protein 8.2 (*)    All other components within normal limits  RESP PANEL BY RT-PCR (RSV, FLU A&B, COVID)  [id number]     EKG Sinus tach Rate: 157 bpm Rhythm: regular P Waves: present before every QRS PR Interval: 130 ms QRS Complex: 76 ms Qtc: 426 ms   RADIOLOGY CT  maxillofacial FINDINGS:   FACIAL BONES: No acute facial fracture. No mandibular dislocation. No suspicious bone lesion.   ORBITS: Globes are intact. No acute traumatic injury. No inflammatory change.   SINUSES AND MASTOIDS: Clear   SOFT TISSUES: No acute abnormality.  IMPRESSION: 1. No acute facial fracture.  CT cervical spine FINDINGS:   BONES AND ALIGNMENT: No acute fracture or traumatic malalignment.   DEGENERATIVE CHANGES: No significant degenerative changes.   SOFT TISSUES: No prevertebral soft tissue swelling.   IMPRESSION: 1. No significant abnormality   CT head FINDINGS:   BRAIN AND VENTRICLES: No acute hemorrhage. No evidence of acute infarct. No hydrocephalus. No extra-axial collection. No mass effect or midline shift.   ORBITS: No acute abnormality.   SINUSES: No acute abnormality.   SOFT TISSUES AND SKULL: No acute soft tissue abnormality. No skull fracture.   IMPRESSION: 1. No acute intracranial abnormality.  PROCEDURES:  Critical Care performed: No   Procedures   MEDICATIONS ORDERED IN ED: Medications  sodium chloride  0.9 % bolus 1,000 mL (0 mLs Intravenous Stopped 12/04/24 2018)  ondansetron  (ZOFRAN ) injection 4 mg (4 mg Intravenous Given 12/04/24 1844)  acetaminophen  (TYLENOL ) tablet 1,000 mg (1,000 mg Oral Given 12/04/24 1844)  diphenhydrAMINE  (BENADRYL ) injection 50 mg (50 mg Intravenous Given 12/04/24 2015)  morphine  (PF) 2 MG/ML injection 2 mg (2 mg Intravenous Given 12/04/24 2016)  promethazine  (PHENERGAN ) 25 mg in sodium chloride  0.9 % 50 mL IVPB (0 mg Intravenous Stopped 12/04/24 2130)  sodium chloride  0.9 % bolus 1,000 mL (0 mLs Intravenous Stopped 12/04/24 2232)  LORazepam  (ATIVAN ) injection 2 mg (2 mg Intravenous Given 12/04/24 2041)     IMPRESSION / MDM / ASSESSMENT AND PLAN / ED COURSE  I reviewed the triage vital signs and the nursing notes.                              Differential diagnosis includes, but is not limited to,  tension headache, migraine, electrolyte abnormality, anemia, orthostatic hypotension, intracranial mass, URI, HTN, sinusitis  Patient's presentation is most consistent with acute complicated illness / injury requiring diagnostic workup.  Patient here with signs and symptoms as described above. Well appearing. BP at 148/97 and mildly tachy at 104 bpm. No fever. She reports her main concern is persistent all-over headache for 2 days; not currently having dizziness or blurry vision on my evaluation. Normal neurological exam. No meningismus. B/l tenderness to maxillary sinuses. No eye pain. Has allergies to prednisone , meloxicam  and intolerance to NSAIDs so will do migraine cocktail with fluids, tylenol  and zofran  and order labs, resp panel, CT head, neck, maxillofacial. Respiratory panel negative for COVID, flu, RSV. CBC unremarkable. CMP unremarkable. UA unremarkable without  any nitrites or leukocytes. CT maxillofacial unremarkable, clear sinuses. CT head w/ no acute intracranial findings. CT cervical spine w/ no significant abnormality.   Re-evaluated patient's pain and headache persists, no improvement. Added benadryl , morphine .  Clinical Course as of 12/05/24 1658  Wed Dec 04, 2024  2039 A few minutes after admin of morphine  and benadryl , patient started to feel dizzy, nauseous, had b/l upper extremity tremoring and became tachy at 150 bpm. Patient became lethargic. Supervising physician, Dr. Viviann stepped into the room to evaluate the patient alongside me. PERRLA. Breath sounds equal b/l.  EKG ordered and shows sinus tach. Dr. Viviann suggested provide ativan , one more round of fluids and re-evaluate after fluids.  RN added morphine  to allergy list. [SD]    Clinical Course User Index [SD] Sheron Salm, PA-C   Patient re-evaluated after completion of second round of fluids and reports her headache has improved. HR now at 77-82 bpm range and breathing normally on room air. She is able to follow  commands and answer questions and will need a ride home due to receiving several meds here. Will send meclizine  for her to use for dizziness and discussed OTC tylenol  use for further headaches with strict return to ER if anything worsens. RN contacted ride for patient. Will have her f/u with neurology outpatient for further workup if needed.   The patient may return to the emergency department for any new, worsening, or concerning symptoms. Patient was given the opportunity to ask questions; all questions were answered. Emergency department return precautions were discussed with the patient.  Patient is in agreement to the treatment plan.  Patient is stable for discharge.   FINAL CLINICAL IMPRESSION(S) / ED DIAGNOSES   Final diagnoses:  Dizziness  Other headache syndrome     Rx / DC Orders   ED Discharge Orders          Ordered    meclizine  (ANTIVERT ) 25 MG tablet  3 times daily PRN        12/04/24 2252             Note:  This document was prepared using Dragon voice recognition software and may include unintentional dictation errors.     Sheron [place], [place] 12/05/24 1658  "

## 2024-12-04 NOTE — ED Provider Notes (Incomplete)
 Procedures  Clinical Course as of 12/04/24 2300  Wed Dec 04, 2024  2039 Rxn to med? [SD]    Clinical Course User Index [SD] Sheron Salm, PA-C    ----------------------------------------- 11:00 PM on 12/04/2024 -----------------------------------------

## 2024-12-04 NOTE — ED Triage Notes (Signed)
 Pt reports headache, nausea, and blurred vision that started yesterday. Reports her BP at work was high.

## 2024-12-04 NOTE — Discharge Instructions (Addendum)
 You have been seen in the Emergency Department (ED) for a headache.  Please use Tylenol  as needed for symptoms, but only as written on the box.  I have also given you a medicine to help with dizziness and send it to your pharmacy. As we have discussed, please follow up with your primary care doctor as soon as possible regarding todays Emergency Department (ED) visit and your headache symptoms to see if further workup is needed.  Also follow-up with the neurologist listed in this paperwork. They can prescribe you further meds if needed.  Call your doctor or return to the ED if you have a worsening headache, sudden and severe headache, confusion, slurred speech, facial droop, weakness or numbness in any arm or leg, extreme fatigue, vision problems, or other symptoms that concern you.

## 2024-12-04 NOTE — Progress Notes (Signed)
 Per unit RN pt will not need new access.

## 2024-12-04 NOTE — ED Notes (Signed)
 Pt called this nurse over stating her vision was blurry, she didn't feel good, and felt that she was going to vomit. Pt leaning over while telling the RN this. Pt given an emesis bag and primary RN notified. ED PA notified as well.

## 2024-12-04 NOTE — Progress Notes (Signed)
 "  Acute Office Visit  Subjective:     Patient ID: Casey Garza, female    DOB: 2001/11/11, 24 y.o.   MRN: [id number]  Chief Complaint  Patient presents with   Hypertension    176/110 today at work, around 2:20pm they re-checked and it was 161/103   Headache    Started yesterday around noon, took Tylenol  but still hurting    HPI Patient is in today for concerns of elevated blood pressure readings. She voices yesterday she had a headache shortly after lunch, and she voices since lunch time yesterday headache has worsened. She voices when she was at work this afternoon she told her coworker she did not feel well. She voices she was dizzy, had vision changes, continued headache and felt that she might pass out. She voices her work checked her blood pressure twice and readings were 176/110 and 161/103. She does endorse hx of gestational hypertension and preeclampsia. She states during her pregnancy she was on blood pressure medication but after one year postpartum her blood pressure medication was stopped. She has been off antihypertensive medications since 2023. She reports overall she thought her blood pressure has been controlled without medication, but voices sometimes that she would eat something that she knew would raise her blood pressure and often would have symptoms.   At time of visit she endorses complaints of blurred vision, light sensitivity, headache, shortness of breath, palpitations, and dizziness. She also voices she has been shaky. Tremors observed. Denies hx of recurrent headaches or chronic migraines.   Review of Systems  Eyes:  Positive for blurred vision and photophobia.  Respiratory:  Positive for shortness of breath.   Cardiovascular:  Positive for palpitations. Negative for chest pain.  Neurological:  Positive for dizziness and headaches. Negative for tingling, seizures and loss of consciousness.        Objective:    BP (!) 152/90   Pulse (!) 105   Resp 16   Ht  5' 8 (1.727 m)   Wt 200 lb (90.7 kg)   LMP 12/04/2024   SpO2 99%   BMI 30.41 kg/m  BP Readings from Last 3 Encounters:  12/04/24 (!) 148/97  12/04/24 (!) 152/90  02/19/24 118/72   Wt Readings from Last 3 Encounters:  12/04/24 200 lb (90.7 kg)  02/19/24 193 lb (87.5 kg)  01/29/24 193 lb (87.5 kg)      Physical Exam Constitutional:      General: She is in acute distress.     Appearance: She is well-developed.  Eyes:     Pupils: Pupils are equal, round, and reactive to light.  Cardiovascular:     Rate and Rhythm: Regular rhythm. Tachycardia present.  Pulmonary:     Effort: Pulmonary effort is normal. No respiratory distress.     Breath sounds: Normal breath sounds. No wheezing, rhonchi or rales.  Skin:    General: Skin is warm and dry.  Neurological:     Mental Status: She is alert.     Motor: Tremor present.  Psychiatric:        Mood and Affect: Mood normal.        Behavior: Behavior normal.        Assessment & Plan:   Assessment & Plan Dizziness Patient is a 24 year old female who is seen in office today with concerns of dizziness. She endorses associated headache with vision changes and light sensitivity. Headache worsening over the last 24 hours. She voices at work earlier today  she felt that she was going to pass out. Denies syncopal episode or LOC.   -Referred to the emergency department  -Return in 1 week for f/u    Elevated blood pressure reading without diagnosis of hypertension Blood pressure significantly elevated without past diagnosis of HTN. She does endorse hx of preeclampsia and gestational hypertension. Previously treated with antihypertensives but has been off of medication for the last 2 years. Associated symptoms include dizziness, changes in vision, palpitations, shortness of breath, and headache  -Referred to the emergency department  -Return in 1 week for f/u. Plan to discuss alternative birth control at time of f/u    Shortness of  breath Patient with complaints of shortness of breath and other associated symptoms as per HPI.   Lung fields clear to auscultation. Patient tachycardiac. Regular rhythm.  -Referred to the emergency department -Return in 1 week for f/u       Return in about 1 week (around 12/11/2024).  LAYMON LOISE CORE, FNP   "

## 2024-12-04 NOTE — ED Notes (Signed)
 Pt given DC instructions. Pt verbalized understanding of medication and follow up care. Pt taken from ED in wheelchair by this RN.

## 2024-12-09 ENCOUNTER — Ambulatory Visit: Admitting: Dermatology

## 2024-12-09 ENCOUNTER — Encounter: Payer: Self-pay | Admitting: Dermatology

## 2024-12-09 DIAGNOSIS — L910 Hypertrophic scar: Secondary | ICD-10-CM

## 2024-12-09 DIAGNOSIS — Z79899 Other long term (current) drug therapy: Secondary | ICD-10-CM

## 2024-12-09 DIAGNOSIS — Z7189 Other specified counseling: Secondary | ICD-10-CM

## 2024-12-09 MED ORDER — TRIAMCINOLONE ACETONIDE 10 MG/ML IJ SUSP
10.0000 mg | Freq: Once | INTRAMUSCULAR | Status: AC
  Administered 2024-12-09: 10 mg

## 2024-12-09 NOTE — Progress Notes (Signed)
" ° °  Follow-Up Visit   Subjective  Casey Garza is a 24 y.o. 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  The following portions of the chart were reviewed this encounter and updated as appropriate: medications, allergies, medical history  Review of Systems:  No other skin or systemic complaints except as noted in HPI or Assessment and Plan.  Objective  Well appearing patient in no apparent distress; mood and affect are within normal limits.  A focused examination was performed of the following areas: suprapubic  Relevant exam findings are noted in the Assessment and Plan.  Suprapubic associated with C-section scar-improving today.  Patient says a softer and smaller but still persistent. Firm pink/brown dermal papule(s)/plaque(s).   Assessment & Plan  KELOID Suprapubic associated with C-section scar-improving today.  Patient says a softer and smaller but still persistent. - Intralesional injection - Suprapubic associated with C-section scar-improving today.  Patient says a softer and smaller but still persistent. Location: suprapubic  Informed Consent: Discussed risks (infection, pain, bleeding, bruising, thinning of the skin, loss of skin pigment, lack of resolution, and recurrence of lesion) and benefits of the procedure, as well as the alternatives. Informed consent was obtained. Preparation: The area was prepared a standard fashion.  Procedure Details: An intralesional injection was performed with Kenalog  10 mg/cc. 0.15 cc in total were injected in an area of length of 2.5 x 0.7 cm.  Total number of injections: 4  Plan: The patient was instructed on post-op care. Recommend OTC analgesia as needed for pain.  NDC 9996-9505-79 Exp date 06/2027 Lot# [id number]   This Visit - triamcinolone  acetonide (KENALOG ) 10 MG/ML injection 10  mg COUNSELING AND COORDINATION OF CARE   MEDICATION MANAGEMENT    Return in about 6 months (around 06/08/2025) for keloid follow up.  Casey Garza, CMA, am acting as scribe for Alm Rhyme, MD .  Documentation: I have reviewed the above documentation for accuracy and completeness, and I agree with the above.  Alm Rhyme, MD    "

## 2024-12-09 NOTE — Patient Instructions (Signed)

## 2025-06-09 ENCOUNTER — Ambulatory Visit: Admitting: Dermatology
